# Patient Record
Sex: Male | Born: 1955 | Race: White | Hispanic: No | Marital: Married | Smoking: Never smoker
Health system: Southern US, Community
[De-identification: ages and names within clinical notes are randomized; demographics above are authoritative.]

## PROBLEM LIST (undated history)

## (undated) DIAGNOSIS — I10 Essential (primary) hypertension: Secondary | ICD-10-CM

## (undated) HISTORY — PX: CHOLECYSTECTOMY: SHX55

## (undated) HISTORY — PX: APPENDECTOMY: SHX54

---

## 2002-12-23 ENCOUNTER — Ambulatory Visit (HOSPITAL_COMMUNITY): Admission: RE | Admit: 2002-12-23 | Discharge: 2002-12-24 | Payer: Self-pay | Admitting: General Surgery

## 2002-12-23 ENCOUNTER — Encounter (INDEPENDENT_AMBULATORY_CARE_PROVIDER_SITE_OTHER): Payer: Self-pay | Admitting: *Deleted

## 2008-01-29 ENCOUNTER — Encounter: Admission: RE | Admit: 2008-01-29 | Discharge: 2008-01-29 | Payer: Self-pay | Admitting: Family Medicine

## 2010-06-17 NOTE — Op Note (Signed)
NAME:  Jon Ingram, Jon Ingram                          ACCOUNT NO.:  [id number]   MEDICAL RECORD NO.:  [id number]                   PATIENT TYPE:  OIB   LOCATION:  NA                                   FACILITY:  [facility]   PHYSICIAN:  Gabrielle Dare. Janee Morn, M.D.             DATE OF BIRTH:  1955/12/09   DATE OF PROCEDURE:  12/23/2002  DATE OF DISCHARGE:                                 OPERATIVE REPORT   PREOPERATIVE DIAGNOSIS:  1. Gallstones.  2. Umbilical hernia.   POSTOPERATIVE DIAGNOSIS:  1. Gallstones.  2. Umbilical hernia.   OPERATION PERFORMED:  1. Laparoscopic cholecystectomy with intraoperative cholangiogram.  2. Repair of umbilical hernia.   SURGEON:  Gabrielle Dare. Janee Morn, M.D.   ASSISTANT:  Sharlet Salina T. Hoxworth, M.D.   ANESTHESIA:  General.   INDICATIONS FOR PROCEDURE:  The patient is a 55 year old white male whom I  evaluated in the office for symptomatic cholelithiasis.  He also had  recently noted an umbilical hernia which was also symptomatic.  He presents  today for elective laparoscopic cholecystectomy with intraoperative  cholangiogram and repair of this umbilical hernia.   DESCRIPTION OF PROCEDURE:  Informed consent was obtained.  The patient  received intravenous antibiotics.  He was taken to the operating room.  General anesthesia was administered.  His abdomen was prepped and draped in  a sterile fashion.  A curvilinear infraumbilical incision was made.  Subcutaneous tissues were dissected down.  The umbilical hernia defect was  less than 1 cm in size.  The umbilicus was circumferentially dissected and  then dissected free off the fascia without damaging the skin.  The defect  was then visible.  There was no content within the hernia at this time.  We  then used this as our entry into the abdomen with plans to close it and fix  the hernia after removing our Hasson trocar at the end of the case.  Subsequently, as 0 Vicryl pursestring suture was placed around this  fascial  opening, the Hasson trocar was inserted into the abdomen and the abdomen was  insufflated with carbon dioxide in standard fashion.  Under direct vision,  an 11 mm epigastric and two 5 mm lateral ports were placed.  The dome of the  gallbladder was retracted superomedially.  The infundibulum was retracted  inferolaterally.  Several adhesions of omentum were present down at the  infundibulum area. These were taken down carefully without difficulty.  Dissection was begun medially and progressed laterally.  First the cystic  artery was found and this was overlying the cystic duct.  The cystic artery  was circumferentially dissected.  Two clips were placed proximal, one distal  and it was divided.  This facilitated better visualization of the cystic  duct which was then easily dissected. A large window was created between the  infundibulum, the cystic duct and the liver.  Once adequate visualization  and dissection had taken  place, the clip was placed on the infundibulocystic  duct junction.  A small nick was made in the cystic duct and a Cook catheter  was used to obtain an intraoperative cholangiogram.  This demonstrated no  filling defects in the common bile duct and there was a segment of the  cystic duct visible at well.  The [facility] catheter was removed and three clips  were placed proximally on the cystic duct and it was divided.  The  gallbladder was then taken off the liver bed with Bovie cautery.  Several  areas of the liver bed were cauterized to get excellent hemostasis. The  gallbladder was placed in an EndoCatch bag and removed through the umbilical  port site.  At this time the liver bed was re-explored.  One small site of  bleeding was cauterized with excellent hemostasis.  The abdomen was  copiously irrigated and the irrigant returned clear.  The liver bed was  rechecked and good hemostasis was maintained.  The ports were removed under  direct vision.  The pneumoperitoneum  was released and the Hasson trocar was  removed.  Subsequently, the umbilical defect was closed first by tying the 0  Vicryl pursestring suture and then several interrupted 0 Vicryl stitches  were placed to reinforce this closure.  The umbilicus was then tacked back  down to the fascia using interrupted 3-0 Vicryl sutures.  All four wounds  were copiously irrigated.  0.25% Marcaine with epinephrine had been used for  local anesthetic.  The skin of each was closed with a running 4-0 Vicryl  subcuticular stitch.  Benzoin and Steri-Strips and sterile dressings were  applied.  Sponge, needle and instrument counts were correct.  The patient  tolerated the procedure without apparent complication and was taken to the  recovery room in stable condition.                                               Gabrielle Dare Janee Morn, M.D.    BET/MEDQ  D:  12/23/2002  T:  12/23/2002  Job:  [id number]

## 2010-08-17 IMAGING — CR DG ELBOW 2V*L*
2 series · 2 of 2 positions shown · non-contrast
Comparison: None

CLINICAL DATA: Pain after lifting heavy object

LEFT ELBOW - 2 VIEW

[view not recorded (1 of 2)]
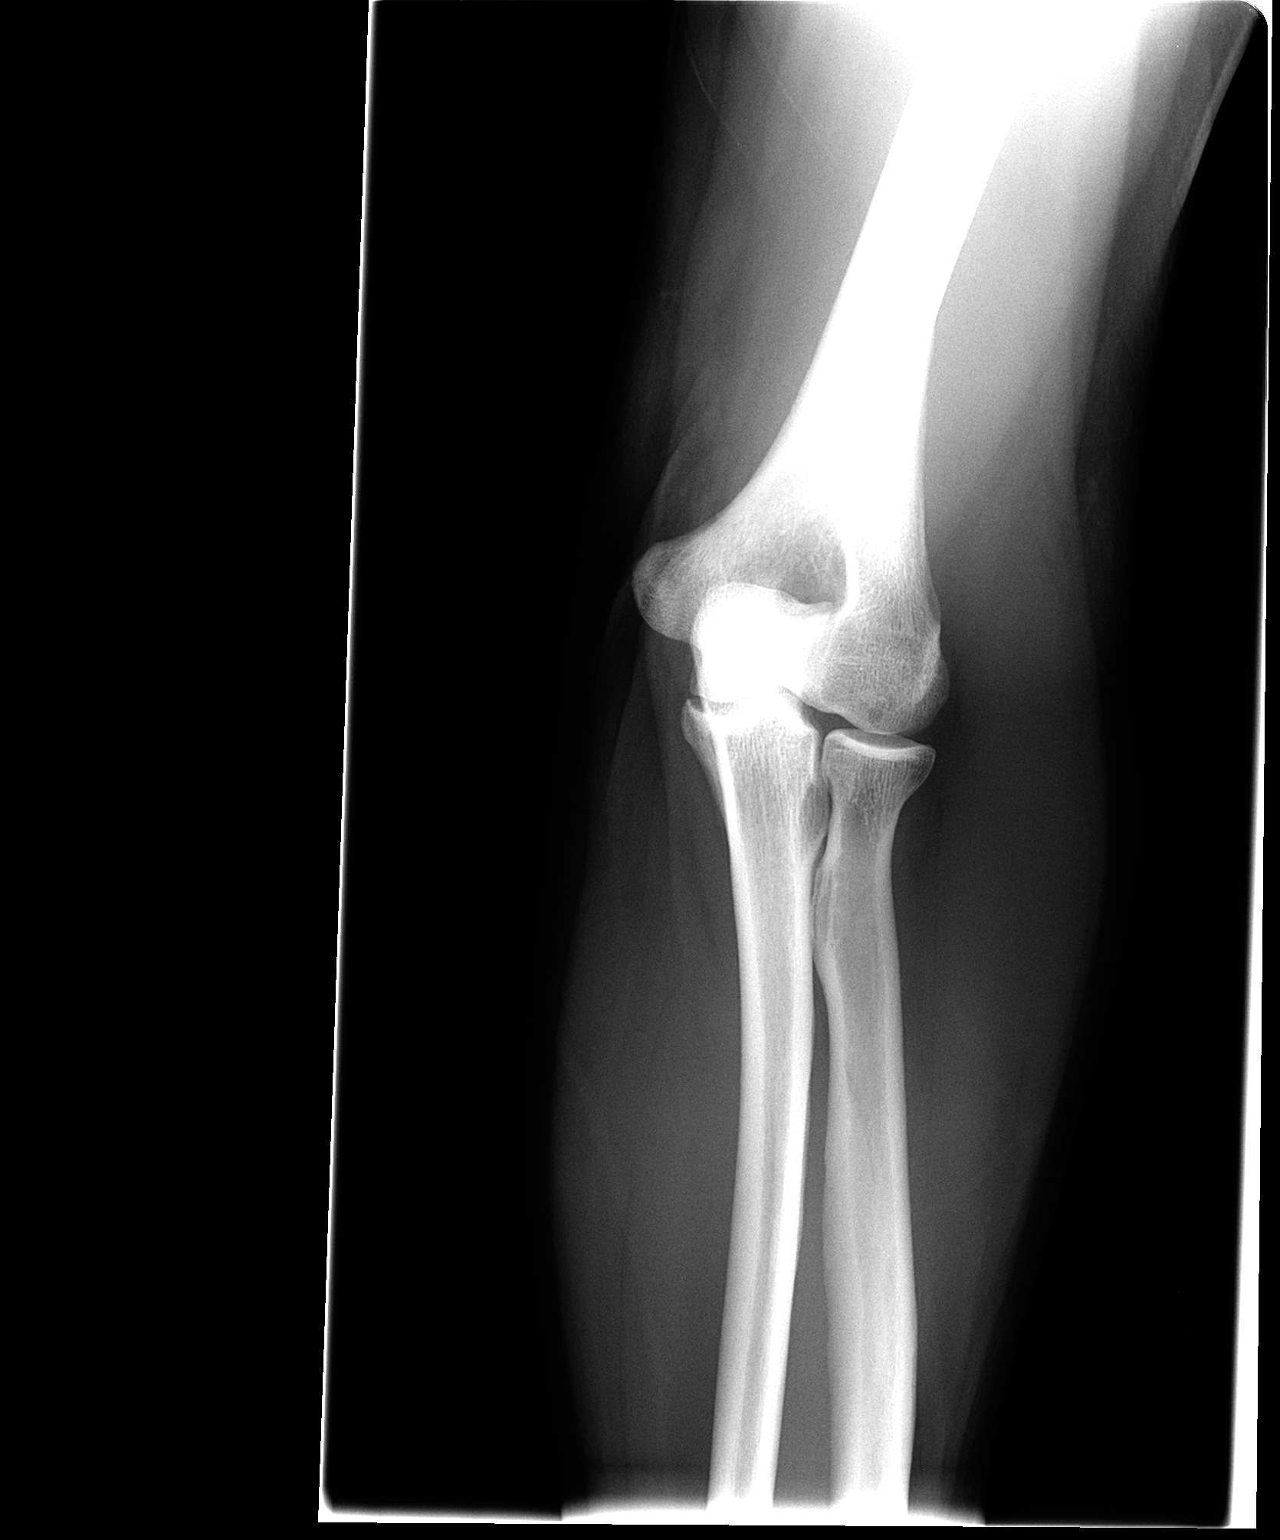

[view not recorded (2 of 2)]
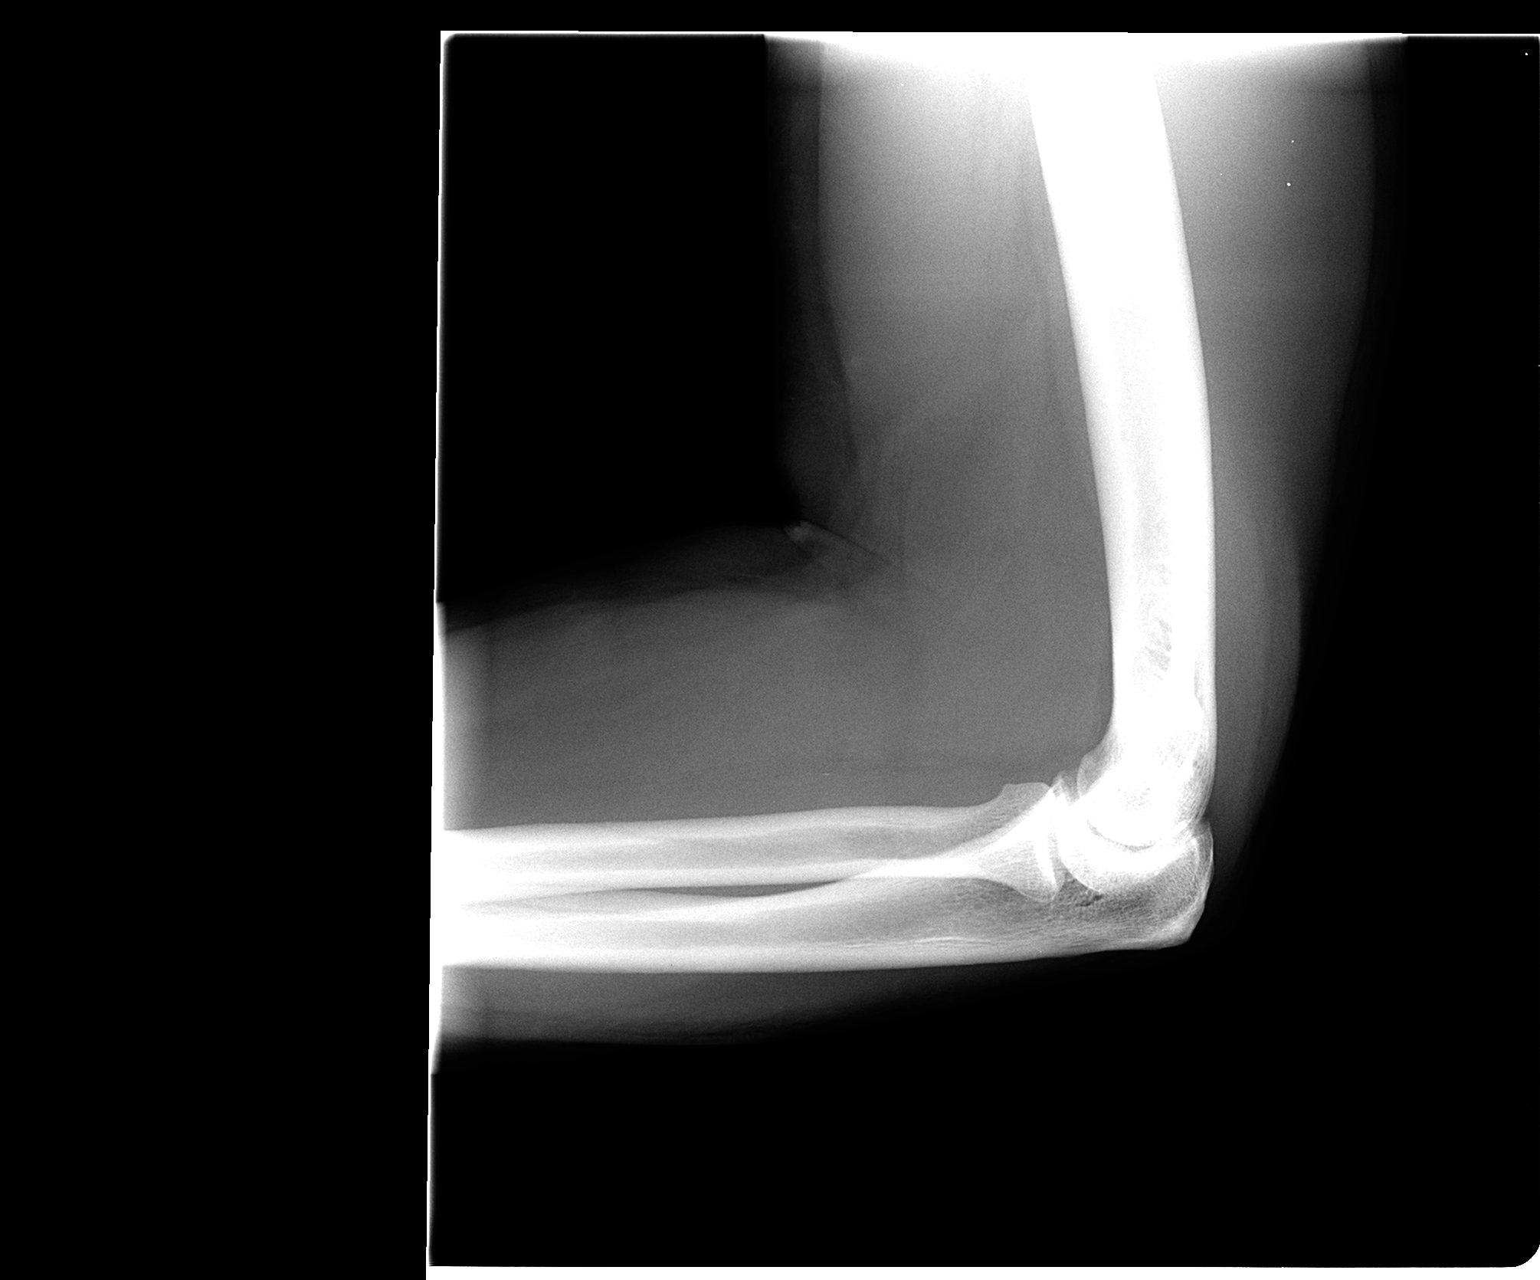

[2 of 2 positions shown; findings below may reference images not displayed]

FINDINGS: Two views of the left elbow show no evidence of left
elbow joint space effusion.  No acute fracture is seen and
alignment is normal.
IMPRESSION: Negative left elbow.

## 2014-03-20 ENCOUNTER — Ambulatory Visit (INDEPENDENT_AMBULATORY_CARE_PROVIDER_SITE_OTHER): Admitting: Podiatry

## 2014-03-20 ENCOUNTER — Encounter: Payer: Self-pay | Admitting: Podiatry

## 2014-03-20 ENCOUNTER — Ambulatory Visit (INDEPENDENT_AMBULATORY_CARE_PROVIDER_SITE_OTHER)

## 2014-03-20 DIAGNOSIS — R52 Pain, unspecified: Secondary | ICD-10-CM

## 2014-03-20 DIAGNOSIS — M19079 Primary osteoarthritis, unspecified ankle and foot: Secondary | ICD-10-CM

## 2014-03-20 DIAGNOSIS — M129 Arthropathy, unspecified: Secondary | ICD-10-CM

## 2014-03-20 NOTE — Progress Notes (Signed)
   Subjective:    Patient ID: Jon Ingram, male    DOB: 03-03-1955, 59 y.o.   MRN: [id number]  HPI 59 year old male presents the office with complaints of bilateral foot pain with the left greater than right. He states that he has had pain in the last 2 years has been progressive. He states he has pain after walking for long distances or with prolonged standing. He has really tried over-the-counter ankle brace which helps some however does not completely resolve his symptoms. He states he previously seen orthopedic surgeon who recommended total ankle replacement or fusion however he does not want either these procedures performed. He denies any history of injury to bilateral fluctuance. No other complaints at this time.  Review of Systems  All other systems reviewed and are negative.      Objective:   Physical Exam AAO 3, NAD DP/PT pulses palpable, CRT less than 3 seconds  Protective sensation intact with Simms Weinstein monofilament, vibratory sensation intact, Achilles tendon reflex intact. There is a significant decrease in medial arch height upon weightbearing with mild valgus deformity ankle. There is pain and mild crepitation with range of motion ankle joint on the left greater than the right. Decreased ability joint range of motion bilaterally left greater than the right however there is no crepitation or no pain with range of motion. Equinus present bilaterally. There is no pinpoint bony tenderness or pain with vibratory sensation of bilateral lower extremity. There is no over 1 edema, erythema, increase in warmth. MMT 5/5, ROM WNL No open lesions or pre-ulcer lesions identified bilaterally. No pain with calf compression, swelling, warmth, erythema.     Assessment & Plan:  59 year old male bilateral ankle pain with the left greater than right with valgus deformity, arthritis -X-rays were obtained and reviewed the patient. There is doing changes of the ankle joint (the right with  valgus deformity present. -At this time I agreed recommend either total ankle replacement or ankle fusion. First any without replacement likely needs a total joint fusion as well. I will see any other short-term fixes surgically. -Patient does not to proceed with surgery this time. Discussed conservative options including AFO/[place] brace. A prescription for this was given to the patient for biotech. -Follow-up after brace. In the meantime occurs all the office with any questions, concerns, change in symptoms.

## 2015-06-04 ENCOUNTER — Emergency Department (HOSPITAL_BASED_OUTPATIENT_CLINIC_OR_DEPARTMENT_OTHER)
Admission: EM | Admit: 2015-06-04 | Discharge: 2015-06-04 | Disposition: A | Discharge status: Home / Self Care | Payer: TRICARE For Life (TFL) | Attending: Emergency Medicine | Admitting: Emergency Medicine

## 2015-06-04 ENCOUNTER — Emergency Department (HOSPITAL_BASED_OUTPATIENT_CLINIC_OR_DEPARTMENT_OTHER): Payer: TRICARE For Life (TFL)

## 2015-06-04 ENCOUNTER — Encounter (HOSPITAL_BASED_OUTPATIENT_CLINIC_OR_DEPARTMENT_OTHER): Payer: Self-pay | Admitting: *Deleted

## 2015-06-04 DIAGNOSIS — Y929 Unspecified place or not applicable: Secondary | ICD-10-CM | POA: Insufficient documentation

## 2015-06-04 DIAGNOSIS — S0093XA Contusion of unspecified part of head, initial encounter: Secondary | ICD-10-CM | POA: Diagnosis not present

## 2015-06-04 DIAGNOSIS — S0990XA Unspecified injury of head, initial encounter: Secondary | ICD-10-CM | POA: Diagnosis present

## 2015-06-04 DIAGNOSIS — W01198A Fall on same level from slipping, tripping and stumbling with subsequent striking against other object, initial encounter: Secondary | ICD-10-CM | POA: Diagnosis not present

## 2015-06-04 DIAGNOSIS — I10 Essential (primary) hypertension: Secondary | ICD-10-CM | POA: Insufficient documentation

## 2015-06-04 DIAGNOSIS — Y939 Activity, unspecified: Secondary | ICD-10-CM | POA: Insufficient documentation

## 2015-06-04 DIAGNOSIS — Y999 Unspecified external cause status: Secondary | ICD-10-CM | POA: Insufficient documentation

## 2015-06-04 HISTORY — DX: Essential (primary) hypertension: I10

## 2015-06-04 NOTE — ED Notes (Signed)
Patient transported to CT 

## 2015-06-04 NOTE — Discharge Instructions (Signed)

## 2015-06-04 NOTE — ED Provider Notes (Addendum)
CSN: [id number]     Arrival date & time 06/04/15  1128 History   First MD Initiated Contact with Patient 06/04/15 1141     Chief Complaint  Patient presents with  . Fall     (Consider location/radiation/quality/duration/timing/severity/associated sxs/prior Treatment) Patient is a 60 y.o. male presenting with fall. The history is provided by the patient.  Fall This is a new (Patient went outside on his deck last night at 3 AM to unclog a drain and when he was going back and he slipped and fell backwards hitting his head onto the wooden deck) problem. The current episode started 6 to 12 hours ago. The problem occurs constantly. The problem has not changed since onset.Associated symptoms include headaches. Associated symptoms comments: Neck pain.  No loss of consciousness, nausea, vomiting. No numbness or weakness in the extremities. No pain shooting into the arms or the legs. No lower back pain. Vision is unchanged.. Nothing aggravates the symptoms. The symptoms are relieved by NSAIDs. He has tried nothing for the symptoms. The treatment provided no relief.    Past Medical History  Diagnosis Date  . Hypertension    Past Surgical History  Procedure Laterality Date  . Cholecystectomy    . Appendectomy     No family history on file. Social History  Substance Use Topics  . Smoking status: Never Smoker   . Smokeless tobacco: None  . Alcohol Use: Yes    Review of Systems  Neurological: Positive for headaches.  All other systems reviewed and are negative.     Allergies  Review of patient's allergies indicates no known allergies.  Home Medications   Prior to Admission medications   Medication Sig Start Date End Date Taking? Authorizing Provider  ibuprofen (ADVIL,MOTRIN) 200 MG tablet Take 200 mg by mouth.    Historical Provider, MD  lisinopril (PRINIVIL,ZESTRIL) 2.5 MG tablet Take 5 mg by mouth.    Historical Provider, MD   BP 155/94 mmHg  Pulse 60  Temp(Src) 98 F (36.7 C)  (Oral)  Resp 18  Ht 5\' 8"  (1.727 m)  Wt 220 lb (99.791 kg)  BMI 33.46 kg/m2  SpO2 98% Physical Exam  Constitutional: He is oriented to person, place, and time. He appears well-developed and well-nourished. No distress.  HENT:  Head: Normocephalic and atraumatic.  Mouth/Throat: Oropharynx is clear and moist.  Eyes: Conjunctivae and EOM are normal. Pupils are equal, round, and reactive to light.  Neck: Normal range of motion. Neck supple. Spinous process tenderness present. No muscular tenderness present.    Cardiovascular: Normal rate, regular rhythm and intact distal pulses.   No murmur heard. Pulmonary/Chest: Effort normal and breath sounds normal. No respiratory distress. He has no wheezes. He has no rales.  Musculoskeletal: Normal range of motion. He exhibits no edema or tenderness.  Neurological: He is alert and oriented to person, place, and time. He has normal strength. No cranial nerve deficit or sensory deficit. Gait normal.  Skin: Skin is warm and dry. No rash noted. No erythema.  Psychiatric: He has a normal mood and affect. His behavior is normal.  Nursing note and vitals reviewed.   ED Course  Procedures (including critical care time) Labs Review Labs Reviewed - No data to display  Imaging Review Ct Head Wo Contrast  06/04/2015  CLINICAL DATA:  Head and neck pain after fall. No reported loss of consciousness. EXAM: CT HEAD WITHOUT CONTRAST CT CERVICAL SPINE WITHOUT CONTRAST TECHNIQUE: Multidetector CT imaging of the head and cervical spine was  performed following the standard protocol without intravenous contrast. Multiplanar CT image reconstructions of the cervical spine were also generated. COMPARISON:  None. FINDINGS: CT HEAD FINDINGS Bony calvarium appears intact. No mass effect or midline shift is noted. Ventricular size is within normal limits. There is no evidence of mass lesion, hemorrhage or acute infarction. CT CERVICAL SPINE FINDINGS No fracture or  spondylolisthesis is noted. Mild anterior osteophyte formation is seen at multiple levels. Disc spaces and posterior facet joints appear to be well maintained. Visualized lung apices appear normal. IMPRESSION: Normal head CT. Minimal degenerative changes are noted. No acute abnormality seen in the cervical spine. Electronically Signed   By: Marijo Conception, M.D.   On: 06/04/2015 12:56   Ct Cervical Spine Wo Contrast  06/04/2015  CLINICAL DATA:  Head and neck pain after fall. No reported loss of consciousness. EXAM: CT HEAD WITHOUT CONTRAST CT CERVICAL SPINE WITHOUT CONTRAST TECHNIQUE: Multidetector CT imaging of the head and cervical spine was performed following the standard protocol without intravenous contrast. Multiplanar CT image reconstructions of the cervical spine were also generated. COMPARISON:  None. FINDINGS: CT HEAD FINDINGS Bony calvarium appears intact. No mass effect or midline shift is noted. Ventricular size is within normal limits. There is no evidence of mass lesion, hemorrhage or acute infarction. CT CERVICAL SPINE FINDINGS No fracture or spondylolisthesis is noted. Mild anterior osteophyte formation is seen at multiple levels. Disc spaces and posterior facet joints appear to be well maintained. Visualized lung apices appear normal. IMPRESSION: Normal head CT. Minimal degenerative changes are noted. No acute abnormality seen in the cervical spine. Electronically Signed   By: Marijo Conception, M.D.   On: 06/04/2015 12:56   I have personally reviewed and evaluated these images and lab results as part of my medical decision-making.   EKG Interpretation None      MDM   Final diagnoses:  Head contusion, initial encounter    Patient with a mechanical fall last night hitting his head on the wooden tach without loss of consciousness. He takes no anticoagulation but continues to have a headache and neck pain this morning. He is neurologically intact on exam. CT of the head and neck  pending.  1:01 PM Imaging is normal. Patient was discharged home.  Blanchie Dessert, MD 06/04/15 [place], MD 06/04/15 [phone]

## 2015-06-04 NOTE — ED Notes (Signed)
Pt ambulatory to discharge window with steady gait

## 2015-06-04 NOTE — ED Notes (Signed)
He slipped and fell backward hitting the back of his head on a wood deck. No LOC. No hematoma. C.o headache.

## 2015-06-23 ENCOUNTER — Telehealth: Payer: Self-pay | Admitting: *Deleted

## 2015-06-23 DIAGNOSIS — M19079 Primary osteoarthritis, unspecified ankle and foot: Secondary | ICD-10-CM

## 2015-06-23 NOTE — Telephone Encounter (Signed)
For a second opinion please contact:  Dr. Selene G. Parekh, MD [address]  [place], [place] [address] [phone]  

## 2015-06-23 NOTE — Telephone Encounter (Addendum)
Pt called for referral to [facility] for Dr. Pasty Spillers an ankle surgeon.  06/24/2015-Informed pt of the preferred referral and pt agreed.  Faxed referral with our address and a request for clinical updates on pt status, clinicals and pt's demographics.

## 2015-06-25 ENCOUNTER — Telehealth: Payer: Self-pay | Admitting: *Deleted

## 2015-06-25 NOTE — Telephone Encounter (Signed)
Fax received pt is scheduled with Dr. Donnie Mesa on 07/19/2015 at 145pm.

## 2015-07-19 DIAGNOSIS — M12579 Traumatic arthropathy, unspecified ankle and foot: Secondary | ICD-10-CM | POA: Insufficient documentation

## 2015-12-28 DIAGNOSIS — M25372 Other instability, left ankle: Secondary | ICD-10-CM | POA: Insufficient documentation

## 2015-12-28 DIAGNOSIS — M6702 Short Achilles tendon (acquired), left ankle: Secondary | ICD-10-CM | POA: Insufficient documentation

## 2016-01-04 DIAGNOSIS — Z96662 Presence of left artificial ankle joint: Secondary | ICD-10-CM | POA: Insufficient documentation

## 2016-01-04 DIAGNOSIS — Z471 Aftercare following joint replacement surgery: Secondary | ICD-10-CM | POA: Insufficient documentation

## 2016-05-08 DIAGNOSIS — M722 Plantar fascial fibromatosis: Secondary | ICD-10-CM | POA: Insufficient documentation

## 2016-09-04 ENCOUNTER — Ambulatory Visit: Admitting: Podiatry

## 2016-09-25 ENCOUNTER — Ambulatory Visit: Admitting: Podiatry

## 2016-10-09 ENCOUNTER — Encounter: Payer: Self-pay | Admitting: Podiatry

## 2016-10-09 ENCOUNTER — Ambulatory Visit (INDEPENDENT_AMBULATORY_CARE_PROVIDER_SITE_OTHER): Admitting: Podiatry

## 2016-10-09 VITALS — BP 138/89 | HR 65 | Ht 67.0 in | Wt 225.0 lb

## 2016-10-09 DIAGNOSIS — M216X9 Other acquired deformities of unspecified foot: Secondary | ICD-10-CM

## 2016-10-09 DIAGNOSIS — M7751 Other enthesopathy of right foot: Secondary | ICD-10-CM

## 2016-10-09 DIAGNOSIS — M21969 Unspecified acquired deformity of unspecified lower leg: Secondary | ICD-10-CM | POA: Diagnosis not present

## 2016-10-09 NOTE — Patient Instructions (Signed)
Seen for painful knot on right foot dorsum. Noted of bone spur over the top of right foot. Also noted of weak first metatarsal bone and excess pronation of the STJ. Will do custom orthotics first before considering removal of the spur. May also benefit from repeat cortisone injections.

## 2016-10-09 NOTE — Progress Notes (Signed)
SUBJECTIVE: 61 y.o. year old male presents complaining of pain over the right foot. He was treated with cortisone injection that gave him some relief. Patient is referred by Dr. Leonette Nutting.  Stated that he has had left ankle replacement surgery and is doing well.   REVIEW OF SYSTEMS: Pertinent items noted in HPI and remainder of comprehensive ROS otherwise negative.  OBJECTIVE: DERMATOLOGIC EXAMINATION: No abnormal skin lesions noted.  VASCULAR EXAMINATION OF LOWER LIMBS: All pedal pulses are palpable with normal pulsation.  Capillary Filling times within 3 seconds in all digits.  No edema or erythema noted. Temperature gradient from tibial crest to dorsum of foot is within normal bilateral.  NEUROLOGIC EXAMINATION OF THE LOWER LIMBS: All epicritic and tactile sensations grossly intact. Sharp and Dull discriminatory sensations at the plantar ball of hallux is intact bilateral.   MUSCULOSKELETAL EXAMINATION: Positive for hypermobile first ray bilateral. Positive for STJ hyperpronation bilateral. Positive of palpable firm mass over dorsum right foot at about 2nd MCJ area.   Patient accompanied copy of right foot X-ray which reveal dorsal spurring of mid tarsal joint right foot in lateral view.   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 Bone spur midfoot dorsum right foot.  PLAN: Reviewed clinical findings and available treatment options, orthotics, proper shoe gear, repeat injections, and surgical options. Patient will return for further treatment.

## 2017-02-20 ENCOUNTER — Other Ambulatory Visit: Payer: Self-pay

## 2017-02-20 ENCOUNTER — Encounter (HOSPITAL_COMMUNITY): Payer: Self-pay

## 2017-02-20 ENCOUNTER — Observation Stay (HOSPITAL_COMMUNITY)
Admission: EM | Admit: 2017-02-20 | Discharge: 2017-02-22 | Disposition: A | Discharge status: Home / Self Care | Payer: TRICARE For Life (TFL) | Attending: Emergency Medicine | Admitting: Emergency Medicine

## 2017-02-20 ENCOUNTER — Emergency Department (HOSPITAL_COMMUNITY): Payer: TRICARE For Life (TFL)

## 2017-02-20 DIAGNOSIS — J181 Lobar pneumonia, unspecified organism: Principal | ICD-10-CM | POA: Diagnosis present

## 2017-02-20 DIAGNOSIS — I10 Essential (primary) hypertension: Secondary | ICD-10-CM | POA: Diagnosis not present

## 2017-02-20 DIAGNOSIS — Z79899 Other long term (current) drug therapy: Secondary | ICD-10-CM | POA: Insufficient documentation

## 2017-02-20 DIAGNOSIS — R079 Chest pain, unspecified: Secondary | ICD-10-CM | POA: Diagnosis not present

## 2017-02-20 DIAGNOSIS — R748 Abnormal levels of other serum enzymes: Secondary | ICD-10-CM | POA: Diagnosis not present

## 2017-02-20 DIAGNOSIS — R0789 Other chest pain: Secondary | ICD-10-CM

## 2017-02-20 DIAGNOSIS — F1099 Alcohol use, unspecified with unspecified alcohol-induced disorder: Secondary | ICD-10-CM | POA: Insufficient documentation

## 2017-02-20 DIAGNOSIS — F1090 Alcohol use, unspecified, uncomplicated: Secondary | ICD-10-CM | POA: Diagnosis present

## 2017-02-20 DIAGNOSIS — R7989 Other specified abnormal findings of blood chemistry: Secondary | ICD-10-CM | POA: Diagnosis present

## 2017-02-20 DIAGNOSIS — Z789 Other specified health status: Secondary | ICD-10-CM | POA: Diagnosis not present

## 2017-02-20 DIAGNOSIS — Z7289 Other problems related to lifestyle: Secondary | ICD-10-CM | POA: Diagnosis present

## 2017-02-20 DIAGNOSIS — R778 Other specified abnormalities of plasma proteins: Secondary | ICD-10-CM | POA: Diagnosis present

## 2017-02-20 LAB — BASIC METABOLIC PANEL
Anion gap: 13 (ref 5–15)
BUN: 20 mg/dL (ref 6–20)
CO2: 22 mmol/L (ref 22–32)
Calcium: 9.3 mg/dL (ref 8.9–10.3)
Chloride: 102 mmol/L (ref 101–111)
Creatinine, Ser: 1.08 mg/dL (ref 0.61–1.24)
GFR calc Af Amer: >60 mL/min (ref >60)
GFR calc non Af Amer: >60 mL/min (ref >60)
Glucose, Bld: 105 mg/dL — ABNORMAL HIGH (ref 65–99)
Potassium: 4 mmol/L (ref 3.5–5.1)
Sodium: 137 mmol/L (ref 135–145)

## 2017-02-20 LAB — I-STAT TROPONIN, ED
Troponin i, poc: 0.03 ng/mL (ref 0.00–0.08)
Troponin i, poc: 0.09 ng/mL (ref 0.00–0.08)

## 2017-02-20 LAB — CBC
HCT: 44.6 % (ref 39.0–52.0)
Hemoglobin: 15.6 g/dL (ref 13.0–17.0)
MCH: 31.1 pg (ref 26.0–34.0)
MCHC: 35 g/dL (ref 30.0–36.0)
MCV: 88.8 fL (ref 78.0–100.0)
Platelets: 227 10*3/uL (ref 150–400)
RBC: 5.02 MIL/uL (ref 4.22–5.81)
RDW: 13.2 % (ref 11.5–15.5)
WBC: 10.5 10*3/uL (ref 4.0–10.5)

## 2017-02-20 LAB — TROPONIN I: TROPONIN I: 0.08 ng/mL — AB (ref <0.03)

## 2017-02-20 MED ORDER — ZOLPIDEM TARTRATE 5 MG PO TABS: 5.0000 mg | ORAL_TABLET | Freq: Every evening | ORAL | Status: DC | PRN

## 2017-02-20 MED ORDER — ALBUTEROL SULFATE (2.5 MG/3ML) 0.083% IN NEBU: 2.5000 mg | INHALATION_SOLUTION | RESPIRATORY_TRACT | Status: DC | PRN

## 2017-02-20 MED ORDER — LISINOPRIL 2.5 MG PO TABS
2.5000 mg | ORAL_TABLET | Freq: Every day | ORAL | Status: DC
  Administered 2017-02-21 – 2017-02-22 (×2): 2.5 mg via ORAL
  Filled 2017-02-20 (×2): qty 1

## 2017-02-20 MED ORDER — ALUM & MAG HYDROXIDE-SIMETH 200-200-20 MG/5ML PO SUSP: 30.0000 mL | Freq: Four times a day (QID) | ORAL | Status: DC | PRN

## 2017-02-20 MED ORDER — HYDRALAZINE HCL 20 MG/ML IJ SOLN: 5.0000 mg | INTRAMUSCULAR | Status: DC | PRN

## 2017-02-20 MED ORDER — MORPHINE SULFATE (PF) 4 MG/ML IV SOLN: 2.0000 mg | INTRAVENOUS | Status: DC | PRN

## 2017-02-20 MED ORDER — DEXTROSE 5 % IV SOLN
1.0000 g | INTRAVENOUS | Status: DC
  Administered 2017-02-21: 1 g via INTRAVENOUS
  Filled 2017-02-20 (×2): qty 10

## 2017-02-20 MED ORDER — ASPIRIN 81 MG PO CHEW
324.0000 mg | CHEWABLE_TABLET | Freq: Once | ORAL | Status: AC
  Administered 2017-02-20: 324 mg via ORAL
  Filled 2017-02-20: qty 4

## 2017-02-20 MED ORDER — VITAMIN B 12 100 MCG PO LOZG: 500.0000 mg | LOZENGE | Freq: Every day | ORAL | Status: DC

## 2017-02-20 MED ORDER — DOXYCYCLINE HYCLATE 100 MG PO TABS
100.0000 mg | ORAL_TABLET | Freq: Two times a day (BID) | ORAL | Status: DC
  Administered 2017-02-20 – 2017-02-22 (×4): 100 mg via ORAL
  Filled 2017-02-20 (×4): qty 1

## 2017-02-20 MED ORDER — ATORVASTATIN CALCIUM 40 MG PO TABS
40.0000 mg | ORAL_TABLET | Freq: Every day | ORAL | Status: DC
  Administered 2017-02-21: 40 mg via ORAL
  Filled 2017-02-20 (×2): qty 1

## 2017-02-20 MED ORDER — ACETAMINOPHEN 325 MG PO TABS
650.0000 mg | ORAL_TABLET | ORAL | Status: DC | PRN
Start: 2017-02-20 — End: 2017-02-22

## 2017-02-20 MED ORDER — LORAZEPAM 2 MG/ML IJ SOLN: 0.0000 mg | Freq: Four times a day (QID) | INTRAMUSCULAR | Status: DC

## 2017-02-20 MED ORDER — SODIUM CHLORIDE 0.9 % IV SOLN
INTRAVENOUS | Status: DC
  Administered 2017-02-20 – 2017-02-21 (×2): via INTRAVENOUS

## 2017-02-20 MED ORDER — ENOXAPARIN SODIUM 40 MG/0.4ML ~~LOC~~ SOLN
40.0000 mg | SUBCUTANEOUS | Status: DC
  Filled 2017-02-20: qty 0.4

## 2017-02-20 MED ORDER — LORAZEPAM 2 MG/ML IJ SOLN: 1.0000 mg | Freq: Four times a day (QID) | INTRAMUSCULAR | Status: DC | PRN

## 2017-02-20 MED ORDER — LORAZEPAM 2 MG/ML IJ SOLN: 0.0000 mg | Freq: Two times a day (BID) | INTRAMUSCULAR | Status: DC

## 2017-02-20 MED ORDER — DEXTROSE 5 % IV SOLN
1.0000 g | Freq: Once | INTRAVENOUS | Status: AC
  Administered 2017-02-20: 1 g via INTRAVENOUS
  Filled 2017-02-20: qty 10

## 2017-02-20 MED ORDER — LORAZEPAM 1 MG PO TABS: 1.0000 mg | ORAL_TABLET | Freq: Four times a day (QID) | ORAL | Status: DC | PRN

## 2017-02-20 MED ORDER — ASPIRIN 81 MG PO CHEW
324.0000 mg | CHEWABLE_TABLET | Freq: Every day | ORAL | Status: DC
  Administered 2017-02-21 – 2017-02-22 (×2): 324 mg via ORAL
  Filled 2017-02-20 (×3): qty 4

## 2017-02-20 MED ORDER — VITAMIN B-1 100 MG PO TABS: 100.0000 mg | ORAL_TABLET | Freq: Every day | ORAL | Status: DC

## 2017-02-20 MED ORDER — PREDNISONE 20 MG PO TABS
20.0000 mg | ORAL_TABLET | Freq: Two times a day (BID) | ORAL | Status: AC
  Administered 2017-02-21 (×2): 20 mg via ORAL
  Filled 2017-02-20 (×2): qty 1

## 2017-02-20 MED ORDER — ADULT MULTIVITAMIN W/MINERALS CH: 1.0000 | ORAL_TABLET | Freq: Every day | ORAL | Status: DC

## 2017-02-20 MED ORDER — FOLIC ACID 1 MG PO TABS: 1.0000 mg | ORAL_TABLET | Freq: Every day | ORAL | Status: DC

## 2017-02-20 MED ORDER — NITROGLYCERIN 0.4 MG SL SUBL: 0.4000 mg | SUBLINGUAL_TABLET | SUBLINGUAL | Status: DC | PRN

## 2017-02-20 MED ORDER — BENZONATATE 100 MG PO CAPS: 100.0000 mg | ORAL_CAPSULE | Freq: Three times a day (TID) | ORAL | Status: DC | PRN

## 2017-02-20 MED ORDER — ONDANSETRON HCL 4 MG/2ML IJ SOLN: 4.0000 mg | Freq: Four times a day (QID) | INTRAMUSCULAR | Status: DC | PRN

## 2017-02-20 MED ORDER — VITAMIN B-12 100 MCG PO TABS
500.0000 ug | ORAL_TABLET | Freq: Every day | ORAL | Status: DC
  Filled 2017-02-20: qty 5

## 2017-02-20 MED ORDER — THIAMINE HCL 100 MG/ML IJ SOLN: 100.0000 mg | Freq: Every day | INTRAMUSCULAR | Status: DC

## 2017-02-20 NOTE — ED Notes (Signed)
Dr. Winfred Leeds not in his office I-stat troponin result given to [place]

## 2017-02-20 NOTE — ED Notes (Signed)
Admitting provider at bedside.

## 2017-02-20 NOTE — ED Notes (Signed)
Coming from [facility]

## 2017-02-20 NOTE — ED Provider Notes (Signed)
[place] EMERGENCY DEPARTMENT Provider Note   CSN: [id number] Arrival date & time: 02/20/17  1459     History   Chief Complaint Chief Complaint  Patient presents with  . Chest Pain    HPI BODI PALMERI is a 62 y.o. male.  HPI Patient presents to the emergency room for evaluation of sharp pain in the bilateral chest.  Patient states he had sudden onset that lasted about 30 minutes a couple of hours ago.  He felt lightheaded it went from the top to the bottom of his chest bilaterally.  He did not feel short of breath he had some nausea but no diaphoresis and the pain did not radiate.  He denies any abdominal pain.  He denies any history of heart or lung disease.  No history of PE or DVT.  No leg swelling.  He has had a recent respiratory illness, bronchitis, sinusitis.  He was treated for sinusitis with azithromycin.  He does continue to have a cough.  Symptoms have all resolved and he feels fine now.  No family history of heart disease.  Patient does not smoke. Past Medical History:  Diagnosis Date  . Hypertension     There are no active problems to display for this patient.   Past Surgical History:  Procedure Laterality Date  . APPENDECTOMY    . CHOLECYSTECTOMY         Home Medications    Prior to Admission medications   Medication Sig Start Date End Date Taking? Authorizing Provider  benzonatate (TESSALON) 100 MG capsule Take 100 mg by mouth 3 (three) times daily as needed for cough.   Yes [provider]  Cyanocobalamin (VITAMIN B 12 PO) Take 500 mg by mouth daily.   Yes [provider]  ibuprofen (ADVIL,MOTRIN) 200 MG tablet Take 200 mg by mouth.   Yes [provider]  lisinopril (PRINIVIL,ZESTRIL) 2.5 MG tablet Take 2.5 mg by mouth daily.    Yes [provider]  azithromycin (ZITHROMAX) 250 MG tablet Take 250 mg by mouth daily.    [provider]    Family History No family history on  file.  Social History Social History   Tobacco Use  . Smoking status: Never Smoker  . Smokeless tobacco: Never Used  Substance Use Topics  . Alcohol use: Yes  . Drug use: No     Allergies   Patient has no known allergies.   Review of Systems Review of Systems  All other systems reviewed and are negative.    Physical Exam Updated Vital Signs BP (!) 150/83 (BP Location: Right Arm)   Pulse (!) 59   Temp 97.6 F (36.4 C) (Oral)   Resp 16   SpO2 97%   Physical Exam  Constitutional: He appears well-developed and well-nourished. No distress.  HENT:  Head: Normocephalic and atraumatic.  Right Ear: External ear normal.  Left Ear: External ear normal.  Eyes: Conjunctivae are normal. Right eye exhibits no discharge. Left eye exhibits no discharge. No scleral icterus.  Neck: Neck supple. No tracheal deviation present.  Cardiovascular: Normal rate, regular rhythm and intact distal pulses.  Pulmonary/Chest: Effort normal and breath sounds normal. No stridor. No respiratory distress. He has no wheezes. He has no rales.  Abdominal: Soft. Bowel sounds are normal. He exhibits no distension. There is no tenderness. There is no rebound and no guarding.  Musculoskeletal: He exhibits no edema or tenderness.  Neurological: He is alert. He has normal strength. No  cranial nerve deficit (no facial droop, extraocular movements intact, no slurred speech) or sensory deficit. He exhibits normal muscle tone. He displays no seizure activity. Coordination normal.  Skin: Skin is warm and dry. No rash noted.  Psychiatric: He has a normal mood and affect.  Nursing note and vitals reviewed.    ED Treatments / Results  Labs (all labs ordered are listed, but only abnormal results are displayed) Labs Reviewed  BASIC METABOLIC PANEL - Abnormal; Notable for the following components:      Result Value   Glucose, Bld 105 (*)    All other components within normal limits  I-STAT TROPONIN, ED -  Abnormal; Notable for the following components:   Troponin i, poc 0.09 (*)    All other components within normal limits  CBC  I-STAT TROPONIN, ED    EKG  EKG Interpretation  Date/Time:  Tuesday February 20 2017 15:01:50 EST Ventricular Rate:  66 PR Interval:  172 QRS Duration: 84 QT Interval:  402 QTC Calculation: 421 R Axis:   0 Text Interpretation:  Normal sinus rhythm Normal ECG No old tracing to compare Confirmed by Dorie Rank [phone]) on 02/20/2017 3:58:47 PM       Radiology Dg Chest 2 View  Result Date: 02/20/2017 CLINICAL DATA:  Chest pain. EXAM: CHEST  2 VIEW COMPARISON:  No recent prior. FINDINGS: Mediastinum and hilar structures normal. Heart size normal. Mild infiltrate noted over the anterior lungs on lateral view. Mild right middle lobe and/or lingular pneumonia cannot be excluded. No pleural effusion or pneumothorax. IMPRESSION: Mild infiltrate noted the anterior chest on lateral view. Mild right middle lobe and/or lingular pneumonia cannot be excluded. Electronically Signed   By: Marcello Moores  Register   On: 02/20/2017 15:57    Procedures Procedures (including critical care time)  Medications Ordered in ED Medications  cefTRIAXone (ROCEPHIN) 1 g in dextrose 5 % 50 mL IVPB (not administered)     Initial Impression / Assessment and Plan / ED Course  I have reviewed the triage vital signs and the nursing notes.  Pertinent labs & imaging results that were available during my care of the patient were reviewed by me and considered in my medical decision making (see chart for details).  Clinical Course as of Feb 21 1919  Tue Feb 20, 2017  1909 Delta troponin increased.    [JK]  1910 Patient has a mildly positive troponin however still overall in the low risk category per heart score.  Pt is also having respiratory sx and cxr suggests possible pna.  Will consult with cardiology.  Consider repeat trop vs admission observation, further eval  [JK]  1920 I discussed the case  with Dr. Harl Bowie.  He was the patient in consultation.  He recommends admission to the medical service.  [JK]    Clinical Course User Index [JK] Dorie Rank, MD    Patient presented to the emergency room for evaluation of chest pain.  Patient's initial cardiac enzymes are normal.  Chest x-ray suggest the possibility of pneumonia and he has had some respiratory symptoms.  Patient's delta troponin however was elevated.  I consulted with cardiology.  Recommendation is to admit to the medical service and cycle the enzymes.  Cardiology will consult on the patient.  I discussed these recommendations with the patient and he agrees.  Final Clinical Impressions(s) / ED Diagnoses   Final diagnoses:  Chest pain, unspecified type  Elevated troponin      Dorie Rank, MD 02/20/17 [place]ly Rim

## 2017-02-20 NOTE — ED Provider Notes (Signed)
Patient placed in Quick Look pathway, seen and evaluated   Chief Complaint: chest pain  HPI:  [name] presents today with complaints of chest pain.  Approximately 2 hours ago patient reports acute onset of anterior sharp chest pain radiating from the top down to the bottom of the chest.  He notes this lasted approximately 30 minutes and then resolved completely.  Patient denies any associated shortness of breath, reports some associated nausea, denies diaphoresis or radiation of symptoms.  He denies any abdominal pain.  Patient with history of hypertension, denies any smoking history, personal or family cardiac history.  Patient denies any history of the same.  Denies any tenderness to the chest or recent heavy lifting.  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  EMS evaluated him with an EKG and recommended he come to the emergency room.  Patient is also currently being treated for bronchitis.  ROS: Chest pain (one) Vitals:   02/20/17 1508  BP: (!) 145/88  Pulse: 67  Resp: 16  Temp: 97.6 F (36.4 C)  SpO2: 97%     Physical Exam:   Gen: No distress  Neuro: Awake and Alert  Skin: Warm    Focused Exam: Cardiac regular rate and rhythm no murmurs     Initiation of care has begun. The patient has been counseled on the process, plan, and necessity for staying for the completion/evaluation, and the remainder of the medical screening examination   Francee Gentile 02/20/17 [place], Julie, MD 02/20/17 [phone]

## 2017-02-20 NOTE — Consult Note (Signed)
Cardiology Consultation:   Patient ID: Jon Ingram; [id number]; 02-04-1955   Admit date: 02/20/2017 Date of Consult: 02/20/2017  Primary Care Provider: Raeanne Gathers, MD Primary Cardiologist: n/a Primary Electrophysiologist:  n/a   Patient Profile:   Jon Ingram is a 62 y.o. male with a hx of HTN who is being seen today for the evaluation of chest pain and troponin elevation at the request of Dr Tomi Bamberger.  History of Present Illness:   Jon Ingram 62 yo male history of HTN presents with chest pain. He reports a 3 week history of ongoing SOB and productive cough. Can have severe coughing spells at times where he feels lightheaded. Reports episode of chest pain today around 1pm while standing at rest. 6/10 pressing feeling in midsternum radiating to left and right side, felt nauseous. Not positional. Lasted about 30 minutes and then resolved on its own.     K 4, Cr 1.08, Hgb 15.6, WBC 10.5  istat trop-->0.03-->0.09 EKG NSR CXR possible pneumonia Past Medical History:  Diagnosis Date  . Hypertension     Past Surgical History:  Procedure Laterality Date  . APPENDECTOMY    . CHOLECYSTECTOMY        Inpatient Medications: Scheduled Meds:  Continuous Infusions:  PRN Meds:   Allergies:   No Known Allergies  Social History:   Social History   Socioeconomic History  . Marital status: Married    Spouse name: Not on file  . Number of children: Not on file  . Years of education: Not on file  . Highest education level: Not on file  Social Needs  . Financial resource strain: Not on file  . Food insecurity - worry: Not on file  . Food insecurity - inability: Not on file  . Transportation needs - medical: Not on file  . Transportation needs - non-medical: Not on file  Occupational History  . Not on file  Tobacco Use  . Smoking status: Never Smoker  . Smokeless tobacco: Never Used  Substance and Sexual Activity  . Alcohol use: Yes  . Drug use: No  . Sexual  activity: Not on file  Other Topics Concern  . Not on file  Social History Narrative  . Not on file    Family History:   No family history on file.   ROS:  Please see the history of present illness.  ROS  All other ROS reviewed and negative.     Physical Exam/Data:   Vitals:   02/20/17 1616 02/20/17 1700 02/20/17 1834 02/20/17 1835  BP: (!) 150/83 (!) 144/88 (!) 158/89   Pulse: (!) 59 (!) 58  (!) 58  Resp: 16 20    Temp:      TempSrc:      SpO2: 97% 95%  97%    Intake/Output Summary (Last 24 hours) at 02/20/2017 1913 Last data filed at 02/20/2017 1902 Gross per 24 hour  Intake 50 ml  Output -  Net 50 ml    General:  Well nourished, well developed, in no acute distress HEENT: normal Lymph: no adenopathy Neck: no JVD Endocrine:  No thryomegaly Cardiac:  normal S1, S2; RRR; no murmur Lungs:  clear to auscultation bilaterally, no wheezing, rhonchi or rales  Abd: soft, nontender, no hepatomegaly  Ext: no edema Musculoskeletal:  No deformities, BUE and BLE strength normal and equal Skin: warm and dry  Neuro:  CNs 2-12 intact, no focal abnormalities noted Psych:  Normal affect       Laboratory Data:  Chemistry Recent Labs  Lab 02/20/17 1514  NA 137  K 4.0  CL 102  CO2 22  GLUCOSE 105*  BUN 20  CREATININE 1.08  CALCIUM 9.3  GFRNONAA >60  GFRAA >60  ANIONGAP 13    No results for input(s): PROT, ALBUMIN, AST, ALT, ALKPHOS, BILITOT in the last 168 hours. Hematology Recent Labs  Lab 02/20/17 1514  WBC 10.5  RBC 5.02  HGB 15.6  HCT 44.6  MCV 88.8  MCH 31.1  MCHC 35.0  RDW 13.2  PLT 227   Cardiac EnzymesNo results for input(s): TROPONINI in the last 168 hours.  Recent Labs  Lab 02/20/17 1525 02/20/17 1839  TROPIPOC 0.03 0.09*    BNPNo results for input(s): BNP, PROBNP in the last 168 hours.  DDimer No results for input(s): DDIMER in the last 168 hours.  Radiology/Studies:  Dg Chest 2 View  Result Date: 02/20/2017 CLINICAL DATA:  Chest  pain. EXAM: CHEST  2 VIEW COMPARISON:  No recent prior. FINDINGS: Mediastinum and hilar structures normal. Heart size normal. Mild infiltrate noted over the anterior lungs on lateral view. Mild right middle lobe and/or lingular pneumonia cannot be excluded. No pleural effusion or pneumothorax. IMPRESSION: Mild infiltrate noted the anterior chest on lateral view. Mild right middle lobe and/or lingular pneumonia cannot be excluded. Electronically Signed   By: Marcello Moores  Register   On: 02/20/2017 15:57    Assessment and Plan:   1. Chest pain/elevated troponin - very mild troponin elevation in setting of probable pneumonia, initial EKG is nonischemic - would monitor overnight on observation, cycle enzymes and EKG, echo tomorrow AM - possible mild demand ischemia in setting of pneumonia would be most likely cause. If significant trop elevation, EKG change, or abnormal echo may warrant ischemic evaluation - please make npo tonight in case further testing indicated.   2. Possible pneumonia - symptoms and CXR consistent with pneumonia.  - ongoing x 3 weeks, has been on azitrho. May require broader coverage, defer to primar team.    For questions or updates, please contact [place] Please consult [email or web address] for contact info under Cardiology/STEMI.   Merrily Pew, MD  02/20/2017 7:13 PM

## 2017-02-20 NOTE — ED Triage Notes (Signed)
Pt reports sudden onset of anterior chest pain. Denies sob or diaphoresis. Endorses nausea.

## 2017-02-20 NOTE — H&P (Signed)
History and Physical    Jon Ingram:[id number] DOB: Dec 05, 1955 DOA: 02/20/2017  Referring MD/NP/PA:   PCP: Raeanne Gathers, MD   Patient coming from:  The patient is coming from home.  At baseline, pt is independent for most of ADL.   Chief Complaint: chest pain and cough  HPI: Jon Ingram is a 61 y.o. male with medical history significant of hypertension, who presents with chest pain and cough.  Patient states that he has been having cough for almost 3 weeks. He was diagnosed as bronchitis and sinusitis by PCP. He has been treated with azithromycin and prednisone. He completed 5-day course of Z-Pak yesterday. Patient continues to have cough with clear mucus production. Denies fever or chills. No shortness of breath. Patient states that he had one episode of chest pain today. It is located in the frontal chest, pressure-like, 6 out of 10 in severity, nonradiating. He described that his chest pain "moved up and down in the front chest". It lasted for about 35 minutes, then resolved spontaneously. Patient has nausea, but no vomiting, diarrhea or abdominal pain. No symptoms of UTI. No unilateral weakness. Patient states that he has been taking Bourbon, 4-5 ounce each night. 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 He dose not have tenderness in the calf areas. Per report, pt was hypertensive with SBP of 170s.  ED Course: pt was found to have troponin 0.03, 0.09, WBC 10.5, electrolytes renal function okay, temperature normal, bradycardia, oxygen saturation 95% on room air. Pt is place on tele bed for obs. Dr. Harl Bowie of Card was consulted by EDP.  Chest x-ray showed: Mild infiltrate noted the anterior chest on lateral view. Mild right middle lobe and/or lingular pneumonia cannot be excluded.  Review of Systems:   General: no fevers, chills, no body weight gain, has fatigue HEENT: no blurry vision, hearing changes or sore throat Respiratory:  no dyspnea,  has coughing, no wheezing CV: has chest pain, no palpitations GI: has nausea, no vomiting, abdominal pain, diarrhea, constipation GU: no dysuria, burning on urination, increased urinary frequency, hematuria  Ext: no leg edema Neuro: no unilateral weakness, numbness, or tingling, no vision change or hearing loss Skin: no rash, no skin tear. MSK: No muscle spasm, no deformity, no limitation of range of movement in spin Heme: No easy bruising.  Travel history: No recent long distant travel.  Allergy: No Known Allergies  Past Medical History:  Diagnosis Date  . Hypertension     Past Surgical History:  Procedure Laterality Date  . APPENDECTOMY    . CHOLECYSTECTOMY      Social History:  reports that  has never smoked. he has never used smokeless tobacco. He reports that he drinks alcohol. He reports that he does not use drugs.  Family History:  Family History  Problem Relation Age of Onset  . Hypertension Mother   . Brain cancer Father   . Hypertension Brother      Prior to Admission medications   Medication Sig Start Date End Date Taking? Authorizing Provider  benzonatate (TESSALON) 100 MG capsule Take 100 mg by mouth 3 (three) times daily as needed for cough.   Yes [provider]  Cyanocobalamin (VITAMIN B 12 PO) Take 500 mg by mouth daily.   Yes [provider]  ibuprofen (ADVIL,MOTRIN) 200 MG tablet Take 200 mg by mouth every 6 (six) hours as needed for mild pain.    Yes [provider]  lisinopril (PRINIVIL,ZESTRIL) 2.5 MG tablet Take 2.5 mg by mouth daily.    Yes [provider]  predniSONE (DELTASONE) 20 MG tablet Take 20 mg by mouth 2 (two) times daily with a meal. x7 days.   Yes [provider]  azithromycin (ZITHROMAX) 250 MG tablet Take 250 mg by mouth daily.    [provider]    Physical Exam: Vitals:   02/20/17 1616 02/20/17 1700 02/20/17 1834 02/20/17 1835  BP: (!) 150/83 (!) 144/88 (!) 158/89    Pulse: (!) 59 (!) 58  (!) 58  Resp: 16 20    Temp:      TempSrc:      SpO2: 97% 95%  97%   General: Not in acute distress HEENT:       Eyes: PERRL, EOMI, no scleral icterus.       ENT: No discharge from the ears and nose, no pharynx injection, no tonsillar enlargement.        Neck: No JVD, no bruit, no mass felt. Heme: No neck lymph node enlargement. Cardiac: S1/S2, RRR, No murmurs, No gallops or rubs. Respiratory: No rales, wheezing, rhonchi or rubs. GI: Soft, nondistended, nontender, no rebound pain, no organomegaly, BS present. GU: No hematuria Ext: No pitting leg edema bilaterally. 2+DP/PT pulse bilaterally. Musculoskeletal: No joint deformities, No joint redness or warmth, no limitation of ROM in spin. Skin: No rashes.  Neuro: Alert, oriented X3, cranial nerves II-XII grossly intact, moves all extremities normally.  Psych: Patient is not psychotic, no suicidal or hemocidal ideation.  Labs on Admission: I have personally reviewed following labs and imaging studies  CBC: Recent Labs  Lab 02/20/17 1514  WBC 10.5  HGB 15.6  HCT 44.6  MCV 88.8  PLT 664   Basic Metabolic Panel: Recent Labs  Lab 02/20/17 1514  NA 137  K 4.0  CL 102  CO2 22  GLUCOSE 105*  BUN 20  CREATININE 1.08  CALCIUM 9.3   GFR: CrCl cannot be calculated (Unknown ideal weight.). Liver Function Tests: No results for input(s): AST, ALT, ALKPHOS, BILITOT, PROT, ALBUMIN in the last 168 hours. No results for input(s): LIPASE, AMYLASE in the last 168 hours. No results for input(s): AMMONIA in the last 168 hours. Coagulation Profile: No results for input(s): INR, PROTIME in the last 168 hours. Cardiac Enzymes: No results for input(s): CKTOTAL, CKMB, CKMBINDEX, TROPONINI in the last 168 hours. BNP (last 3 results) No results for input(s): PROBNP in the last 8760 hours. HbA1C: No results for input(s): HGBA1C in the last 72 hours. CBG: No results for input(s): GLUCAP in the last 168 hours. Lipid  Profile: No results for input(s): CHOL, HDL, LDLCALC, TRIG, CHOLHDL, LDLDIRECT in the last 72 hours. Thyroid Function Tests: No results for input(s): TSH, T4TOTAL, FREET4, T3FREE, THYROIDAB in the last 72 hours. Anemia Panel: No results for input(s): VITAMINB12, FOLATE, FERRITIN, TIBC, IRON, RETICCTPCT in the last 72 hours. Urine analysis: No results found for: COLORURINE, APPEARANCEUR, LABSPEC, PHURINE, GLUCOSEU, HGBUR, BILIRUBINUR, KETONESUR, PROTEINUR, UROBILINOGEN, NITRITE, LEUKOCYTESUR Sepsis Labs: @LABRCNTIP (procalcitonin:4,lacticidven:4) )No results found for this or any previous visit (from the past 240 hour(s)).   Radiological Exams on Admission: Dg Chest 2 View  Result Date: 02/20/2017 CLINICAL DATA:  Chest pain. EXAM: CHEST  2 VIEW COMPARISON:  No recent prior. FINDINGS: Mediastinum and hilar structures normal. Heart size normal. Mild infiltrate noted over the anterior lungs on lateral view. Mild right middle lobe and/or lingular pneumonia cannot be excluded. No pleural effusion or pneumothorax.  IMPRESSION: Mild infiltrate noted the anterior chest on lateral view. Mild right middle lobe and/or lingular pneumonia cannot be excluded. Electronically Signed   By: Marcello Moores  Register   On: 02/20/2017 15:57     EKG: Independently reviewed. Sinus rhythm, QTC 421, LAD, nonspecific T-wave change.    Assessment/Plan Principal Problem:   Chest pain Active Problems:   Hypertension   Lobar pneumonia (HCC)   Alcohol use   Elevated troponin   Chest pain and elevated trop: trop 0.03-->0.09. Dr. Harl Bowie of Card was consulted. Per Dr. Harl Bowie, "possible mild demand ischemia in setting of pneumonia would be most likely cause". He recommended to cycle enzymes and EKG, echo tomorrow AM. 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 Will keep this risk factor  in mind. If patient develops shortness of breath or tenderness in the calf areas, will do workup for PE.  - will place on Tele bed for obs - highly appreciate Dr.Branch's recommendation - cycle CE q6 x3 and repeat EKG in the am  - prn Nitroglycerin, Morphine, and aspirin - will start lipitor, 40 mg daily - Risk factor stratification: will check FLP, UDS and A1C  - 2d echo  Possible lobar pneumonia: Patient has been treated with Z-Pak and prednisone by PCP for bronchitis and sinusitis. Chest x-ray showed infiltration. Pt still has cough. His chest pain could also be related. Will broaden the abx treatment. Pt is on prednisone (2 days left for 7 days course). - IV Rocephin and doxycycline - prn Tesselon for cough  - prn Albuterol Nebs for SOB - Urine legionella and S. pneumococcal antigen - Follow up blood culture x2, sputum culture and plus Flu pcr  HTN:  -Continue home medications: Lisinopril -IV hydralazine prn  Alcohol use: currently no signs of witchdraw -put pt on CIWA   DVT ppx: SQ Lovenox Code Status: Full code Family Communication: Yes, patient's wife at bed side Disposition Plan:  Anticipate discharge back to previous home environment Consults called:  Dr. Harl Bowie of card Admission status: Obs / tele    Date of Service 02/20/2017    Ivor Costa [facility] Pager [phone]  If 7PM-7AM, please contact night-coverage [email or web address] Password [facility] 02/20/2017, 8:15 PM

## 2017-02-20 NOTE — Plan of Care (Signed)
Patient without chest pain tonight

## 2017-02-21 ENCOUNTER — Observation Stay (HOSPITAL_BASED_OUTPATIENT_CLINIC_OR_DEPARTMENT_OTHER): Payer: TRICARE For Life (TFL)

## 2017-02-21 DIAGNOSIS — R748 Abnormal levels of other serum enzymes: Secondary | ICD-10-CM

## 2017-02-21 DIAGNOSIS — R079 Chest pain, unspecified: Secondary | ICD-10-CM | POA: Diagnosis not present

## 2017-02-21 DIAGNOSIS — F1099 Alcohol use, unspecified with unspecified alcohol-induced disorder: Secondary | ICD-10-CM | POA: Diagnosis not present

## 2017-02-21 DIAGNOSIS — J181 Lobar pneumonia, unspecified organism: Secondary | ICD-10-CM | POA: Diagnosis not present

## 2017-02-21 DIAGNOSIS — I10 Essential (primary) hypertension: Secondary | ICD-10-CM | POA: Diagnosis not present

## 2017-02-21 DIAGNOSIS — Z789 Other specified health status: Secondary | ICD-10-CM

## 2017-02-21 LAB — RAPID URINE DRUG SCREEN, HOSP PERFORMED
AMPHETAMINES: NOT DETECTED
BENZODIAZEPINES: NOT DETECTED
Barbiturates: NOT DETECTED
Cocaine: NOT DETECTED
OPIATES: NOT DETECTED
Tetrahydrocannabinol: NOT DETECTED

## 2017-02-21 LAB — LIPID PANEL
CHOL/HDL RATIO: 4.3 ratio
Cholesterol: 153 mg/dL (ref 0–200)
HDL: 36 mg/dL — AB (ref >40)
LDL CALC: 77 mg/dL (ref 0–99)
TRIGLYCERIDES: 199 mg/dL — AB (ref <150)
VLDL: 40 mg/dL (ref 0–40)

## 2017-02-21 LAB — ECHOCARDIOGRAM COMPLETE: Weight: 3644.8 oz

## 2017-02-21 LAB — HEMOGLOBIN A1C
Hgb A1c MFr Bld: 5 % (ref 4.8–5.6)
MEAN PLASMA GLUCOSE: 96.8 mg/dL

## 2017-02-21 LAB — STREP PNEUMONIAE URINARY ANTIGEN: Strep Pneumo Urinary Antigen: NEGATIVE

## 2017-02-21 LAB — INFLUENZA PANEL BY PCR (TYPE A & B)
INFLAPCR: NEGATIVE
INFLBPCR: NEGATIVE

## 2017-02-21 LAB — TROPONIN I
Troponin I: 0.05 ng/mL (ref <0.03)
Troponin I: <0.03 ng/mL (ref <0.03)

## 2017-02-21 LAB — HIV ANTIBODY (ROUTINE TESTING W REFLEX): HIV Screen 4th Generation wRfx: NONREACTIVE

## 2017-02-21 MED ORDER — AMOXICILLIN-POT CLAVULANATE 875-125 MG PO TABS: 1.0000 | ORAL_TABLET | Freq: Two times a day (BID) | ORAL | 0 refills | Status: AC

## 2017-02-21 MED ORDER — ATORVASTATIN CALCIUM 40 MG PO TABS: 40.0000 mg | ORAL_TABLET | Freq: Every day | ORAL | 0 refills | Status: AC

## 2017-02-21 NOTE — Discharge Summary (Signed)
Physician Discharge Summary  Jon Ingram SWH:[id number] DOB: Jun 11, 1955 DOA: 02/20/2017  PCP: Raeanne Gathers, MD  Admit date: 02/20/2017 Discharge date: 02/21/2017   Recommendations for Outpatient Follow-Up:   1. Outpatient stress test 2. abx changed   Discharge Diagnosis:   Principal Problem:   Chest pain Active Problems:   Hypertension   Lobar pneumonia (HCC)   Alcohol use   Elevated troponin   Discharge disposition:  Home.  Discharge Condition: Improved.  Diet recommendation: Low sodium, heart healthy.  Wound care: None.   History of Present Illness:  Jon Ingram is a 62 y.o. male with medical history significant of hypertension, who presents with chest pain and cough.  Patient states that he has been having cough for almost 3 weeks. He was diagnosed as bronchitis and sinusitis by PCP. He has been treated with azithromycin and prednisone. He completed 5-day course of Z-Pak yesterday. Patient continues to have cough with clear mucus production. Denies fever or chills. No shortness of breath. Patient states that he had one episode of chest pain today. It is located in the frontal chest, pressure-like, 6 out of 10 in severity, nonradiating. He described that his chest pain "moved up and down in the front chest". It lasted for about 35 minutes, then resolved spontaneously. Patient has nausea, but no vomiting, diarrhea or abdominal pain. No symptoms of UTI. No unilateral weakness. Patient states that he has been taking Bourbon, 4-5 ounce each night. 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 He dose not have tenderness in the calf areas. Per report, pt was hypertensive with SBP of 170s.     Hospital Course by Problem:   Elevated troponin -suspect due to PNA -seen by cards-- plan for outpatient stress test by cards -echo:- Left ventricle: The cavity size was normal. Wall thickness was   normal. Systolic function was  normal. The estimated ejection   fraction was in the range of 60% to 65%. Wall motion was normal;   there were no regional wall motion abnormalities. Doppler   parameters are consistent with abnormal left ventricular   relaxation (grade 1 diastolic dysfunction).  CAP -treat with PO augmentin  Alcohol use -encourage cessation  HTN -resume home meds   Medical Consultants:    cardiology   Discharge Exam:   Vitals:   02/21/17 1155 02/21/17 1619  BP: 139/80 (!) 150/77  Pulse: 64 62  Resp: 20 20  Temp: 98 F (36.7 C) 98.6 F (37 C)  SpO2: 97% 96%   Vitals:   02/21/17 0625 02/21/17 0810 02/21/17 1155 02/21/17 1619  BP: 125/81 133/89 139/80 (!) 150/77  Pulse: (!) 56 (!) 55 64 62  Resp:  20 20 20   Temp: 97.7 F (36.5 C) 97.9 F (36.6 C) 98 F (36.7 C) 98.6 F (37 C)  TempSrc: Oral Oral Oral Oral  SpO2: 98% 95% 97% 96%  Weight:        Gen:  NAD    The results of significant diagnostics from this hospitalization (including imaging, microbiology, ancillary and laboratory) are listed below for reference.     Procedures and Diagnostic Studies:   Dg Chest 2 View  Result Date: 02/20/2017 CLINICAL DATA:  Chest pain. EXAM: CHEST  2 VIEW COMPARISON:  No recent prior. FINDINGS: Mediastinum and hilar structures normal. Heart size normal. Mild infiltrate noted over the anterior lungs on lateral view. Mild right middle lobe and/or lingular pneumonia cannot be excluded. No pleural  effusion or pneumothorax. IMPRESSION: Mild infiltrate noted the anterior chest on lateral view. Mild right middle lobe and/or lingular pneumonia cannot be excluded. Electronically Signed   By: Marcello Moores  Register   On: 02/20/2017 15:57     Labs:   Basic Metabolic Panel: Recent Labs  Lab 02/20/17 1514  NA 137  K 4.0  CL 102  CO2 22  GLUCOSE 105*  BUN 20  CREATININE 1.08  CALCIUM 9.3   GFR Estimated Creatinine Clearance: 80.2 mL/min (by C-G formula based on SCr of 1.08 mg/dL). Liver  Function Tests: No results for input(s): AST, ALT, ALKPHOS, BILITOT, PROT, ALBUMIN in the last 168 hours. No results for input(s): LIPASE, AMYLASE in the last 168 hours. No results for input(s): AMMONIA in the last 168 hours. Coagulation profile No results for input(s): INR, PROTIME in the last 168 hours.  CBC: Recent Labs  Lab 02/20/17 1514  WBC 10.5  HGB 15.6  HCT 44.6  MCV 88.8  PLT 227   Cardiac Enzymes: Recent Labs  Lab 02/20/17 2103 02/21/17 0209 02/21/17 0703  TROPONINI 0.08* 0.05* <0.03   BNP: Invalid input(s): POCBNP CBG: No results for input(s): GLUCAP in the last 168 hours. D-Dimer No results for input(s): DDIMER in the last 72 hours. Hgb A1c Recent Labs    02/21/17 0209  HGBA1C 5.0   Lipid Profile Recent Labs    02/21/17 0209  CHOL 153  HDL 36*  LDLCALC 77  TRIG 199*  CHOLHDL 4.3   Thyroid function studies No results for input(s): TSH, T4TOTAL, T3FREE, THYROIDAB in the last 72 hours.  Invalid input(s): FREET3 Anemia work up No results for input(s): VITAMINB12, FOLATE, FERRITIN, TIBC, IRON, RETICCTPCT in the last 72 hours. Microbiology Recent Results (from the past 240 hour(s))  Culture, blood (routine x 2) Call MD if unable to obtain prior to antibiotics being given     Status: None (Preliminary result)   Collection Time: 02/20/17  9:03 PM  Result Value Ref Range Status   Specimen Description BLOOD RIGHT ANTECUBITAL  Final   Special Requests IN PEDIATRIC BOTTLE Blood Culture adequate volume  Final   Culture NO GROWTH < 12 HOURS  Final   Report Status PENDING  Incomplete  Culture, blood (routine x 2) Call MD if unable to obtain prior to antibiotics being given     Status: None (Preliminary result)   Collection Time: 02/20/17  9:07 PM  Result Value Ref Range Status   Specimen Description BLOOD RIGHT HAND  Final   Special Requests IN PEDIATRIC BOTTLE Blood Culture adequate volume  Final   Culture NO GROWTH < 12 HOURS  Final   Report Status  PENDING  Incomplete     Discharge Instructions:   Discharge Instructions    Diet - low sodium heart healthy   Complete by:  As directed    Discharge instructions   Complete by:  As directed    Increase activity slowly   Complete by:  As directed      Allergies as of 02/21/2017   No Known Allergies     Medication List    STOP taking these medications   azithromycin 250 MG tablet Commonly known as:  ZITHROMAX   ibuprofen 200 MG tablet Commonly known as:  ADVIL,MOTRIN     TAKE these medications   amoxicillin-clavulanate 875-125 MG tablet Commonly known as:  AUGMENTIN Take 1 tablet by mouth 2 (two) times daily.   atorvastatin 40 MG tablet Commonly known as:  LIPITOR Take 1 tablet (  40 mg total) by mouth daily at 6 PM.   benzonatate 100 MG capsule Commonly known as:  TESSALON Take 100 mg by mouth 3 (three) times daily as needed for cough.   lisinopril 2.5 MG tablet Commonly known as:  PRINIVIL,ZESTRIL Take 2.5 mg by mouth daily.   predniSONE 20 MG tablet Commonly known as:  DELTASONE Take 20 mg by mouth 2 (two) times daily with a meal. x7 days.   VITAMIN B 12 PO Take 500 mg by mouth daily.      Follow-up Information    Raeanne Gathers, MD Follow up in 1 week(s).   Specialty:  Family Medicine Contact information: [address] [place] [address] [phone]        Pixie Casino, MD Follow up.   Specialty:  Cardiology Why:  for outpatient stress test Contact information: [address] [place] [address] [phone]            Time coordinating discharge: 35 min  Signed:  Geradine Girt   [facility] 02/21/2017, 4:29 PM

## 2017-02-21 NOTE — Progress Notes (Signed)
2D Echocardiogram has been performed.  Jon Ingram 02/21/2017, 3:25 PM

## 2017-02-21 NOTE — Progress Notes (Signed)
Followed up with microbiology concerning the influenza panel that has not resulted. It was received last night and will be resulted now.

## 2017-02-21 NOTE — Progress Notes (Signed)
DAILY PROGRESS NOTE   Patient Name: Jon Ingram Date of Encounter: 02/21/2017  Chief Complaint   No chest pain or dyspnea  Patient Profile   BARTLETT ENKE is a 62 y.o. male with a hx of HTN who is being seen today for the evaluation of chest pain and troponin elevation at the request of Dr Tomi Bamberger.  Subjective   Troponin stable and mildly elevated overnight in the setting of pneumonia. Echo is pending. Denies chest pain today.   Objective   Vitals:   02/20/17 2100 02/21/17 0036 02/21/17 0625 02/21/17 0810  BP: (!) 156/91 137/66 125/81 133/89  Pulse:  61 (!) 56 (!) 55  Resp:  18  20  Temp:  98.4 F (36.9 C) 97.7 F (36.5 C) 97.9 F (36.6 C)  TempSrc:  Oral Oral Oral  SpO2:  96% 98% 95%  Weight:        Intake/Output Summary (Last 24 hours) at 02/21/2017 0957 Last data filed at 02/21/2017 3536 Gross per 24 hour  Intake 1416.67 ml  Output -  Net 1416.67 ml   Filed Weights   02/20/17 2059  Weight: 227 lb 12.8 oz (103.3 kg)    Physical Exam   General appearance: alert and no distress Lungs: clear to auscultation bilaterally Heart: regular rate and rhythm Extremities: extremities normal, atraumatic, no cyanosis or edema Neurologic: Grossly normal  Inpatient Medications    Scheduled Meds: . aspirin  324 mg Oral Daily  . atorvastatin  40 mg Oral q1800  . doxycycline  100 mg Oral Q12H  . enoxaparin (LOVENOX) injection  40 mg Subcutaneous Q24H  . folic acid  1 mg Oral Daily  . lisinopril  2.5 mg Oral Daily  . LORazepam  0-4 mg Intravenous Q6H   Followed by  . [START ON 02/22/2017] LORazepam  0-4 mg Intravenous Q12H  . multivitamin with minerals  1 tablet Oral Daily  . predniSONE  20 mg Oral BID WC  . thiamine  100 mg Oral Daily   Or  . thiamine  100 mg Intravenous Daily  . vitamin B-12  500 mcg Oral Daily    Continuous Infusions: . sodium chloride 100 mL/hr at 02/21/17 0644  . cefTRIAXone (ROCEPHIN)  IV      PRN Meds: acetaminophen, albuterol,  alum & mag hydroxide-simeth, benzonatate, hydrALAZINE, LORazepam **OR** LORazepam, morphine injection, nitroGLYCERIN, ondansetron (ZOFRAN) IV, zolpidem   Labs   Results for orders placed or performed during the hospital encounter of 02/20/17 (from the past 48 hour(s))  Basic metabolic panel     Status: Abnormal   Collection Time: 02/20/17  3:14 PM  Result Value Ref Range   Sodium 137 135 - 145 mmol/L   Potassium 4.0 3.5 - 5.1 mmol/L   Chloride 102 101 - 111 mmol/L   CO2 22 22 - 32 mmol/L   Glucose, Bld 105 (H) 65 - 99 mg/dL   BUN 20 6 - 20 mg/dL   Creatinine, Ser 1.08 0.61 - 1.24 mg/dL   Calcium 9.3 8.9 - 10.3 mg/dL   GFR calc non Af Amer >60 >60 mL/min   GFR calc Af Amer >60 >60 mL/min    Comment: (NOTE) The eGFR has been calculated using the CKD EPI equation. This calculation has not been validated in all clinical situations. eGFR's persistently <60 mL/min signify possible Chronic Kidney Disease.    Anion gap 13 5 - 15  CBC     Status: None   Collection Time: 02/20/17  3:14 PM  Result Value Ref Range   WBC 10.5 4.0 - 10.5 K/uL   RBC 5.02 4.22 - 5.81 MIL/uL   Hemoglobin 15.6 13.0 - 17.0 g/dL   HCT 44.6 39.0 - 52.0 %   MCV 88.8 78.0 - 100.0 fL   MCH 31.1 26.0 - 34.0 pg   MCHC 35.0 30.0 - 36.0 g/dL   RDW 13.2 11.5 - 15.5 %   Platelets 227 150 - 400 K/uL  I-stat troponin, ED     Status: None   Collection Time: 02/20/17  3:25 PM  Result Value Ref Range   Troponin i, poc 0.03 0.00 - 0.08 ng/mL   Comment 3            Comment: Due to the release kinetics of cTnI, a negative result within the first hours of the onset of symptoms does not rule out myocardial infarction with certainty. If myocardial infarction is still suspected, repeat the test at appropriate intervals.   I-stat troponin, ED     Status: Abnormal   Collection Time: 02/20/17  6:39 PM  Result Value Ref Range   Troponin i, poc 0.09 (HH) 0.00 - 0.08 ng/mL   Comment NOTIFIED PHYSICIAN    Comment 3             Comment: Due to the release kinetics of cTnI, a negative result within the first hours of the onset of symptoms does not rule out myocardial infarction with certainty. If myocardial infarction is still suspected, repeat the test at appropriate intervals.   Troponin I (q 6hr x 3)     Status: Abnormal   Collection Time: 02/20/17  9:03 PM  Result Value Ref Range   Troponin I 0.08 (HH) <0.03 ng/mL    Comment: CRITICAL RESULT CALLED TO, READ BACK BY AND VERIFIED WITH: C HALL,RN 2232 02/20/2017 WBOND   Culture, blood (routine x 2) Call MD if unable to obtain prior to antibiotics being given     Status: None (Preliminary result)   Collection Time: 02/20/17  9:03 PM  Result Value Ref Range   Specimen Description BLOOD RIGHT ANTECUBITAL    Special Requests IN PEDIATRIC BOTTLE Blood Culture adequate volume    Culture NO GROWTH < 12 HOURS    Report Status PENDING   Culture, blood (routine x 2) Call MD if unable to obtain prior to antibiotics being given     Status: None (Preliminary result)   Collection Time: 02/20/17  9:07 PM  Result Value Ref Range   Specimen Description BLOOD RIGHT HAND    Special Requests IN PEDIATRIC BOTTLE Blood Culture adequate volume    Culture NO GROWTH < 12 HOURS    Report Status PENDING   Rapid urine drug screen (hospital performed)     Status: None   Collection Time: 02/20/17 10:56 PM  Result Value Ref Range   Opiates NONE DETECTED NONE DETECTED   Cocaine NONE DETECTED NONE DETECTED   Benzodiazepines NONE DETECTED NONE DETECTED   Amphetamines NONE DETECTED NONE DETECTED   Tetrahydrocannabinol NONE DETECTED NONE DETECTED   Barbiturates NONE DETECTED NONE DETECTED    Comment: (NOTE) DRUG SCREEN FOR MEDICAL PURPOSES ONLY.  IF CONFIRMATION IS NEEDED FOR ANY PURPOSE, NOTIFY LAB WITHIN 5 DAYS. LOWEST DETECTABLE LIMITS FOR URINE DRUG SCREEN Drug Class                     Cutoff (ng/mL) Amphetamine and metabolites    1000 Barbiturate and metabolites     200 Benzodiazepine  916 Tricyclics and metabolites     300 Opiates and metabolites        300 Cocaine and metabolites        300 THC                            50   Strep pneumoniae urinary antigen     Status: None   Collection Time: 02/20/17 10:56 PM  Result Value Ref Range   Strep Pneumo Urinary Antigen NEGATIVE NEGATIVE    Comment:        Infection due to S. pneumoniae cannot be absolutely ruled out since the antigen present may be below the detection limit of the test.   Hemoglobin A1c     Status: None   Collection Time: 02/21/17  2:09 AM  Result Value Ref Range   Hgb A1c MFr Bld 5.0 4.8 - 5.6 %    Comment: (NOTE) Pre diabetes:          5.7%-6.4% Diabetes:              >6.4% Glycemic control for   <7.0% adults with diabetes    Mean Plasma Glucose 96.8 mg/dL  Lipid panel     Status: Abnormal   Collection Time: 02/21/17  2:09 AM  Result Value Ref Range   Cholesterol 153 0 - 200 mg/dL   Triglycerides 199 (H) <150 mg/dL   HDL 36 (L) >40 mg/dL   Total CHOL/HDL Ratio 4.3 RATIO   VLDL 40 0 - 40 mg/dL   LDL Cholesterol 77 0 - 99 mg/dL    Comment:        Total Cholesterol/HDL:CHD Risk Coronary Heart Disease Risk Table                     Men   Women  1/2 Average Risk   3.4   3.3  Average Risk       5.0   4.4  2 X Average Risk   9.6   7.1  3 X Average Risk  23.4   11.0        Use the calculated Patient Ratio above and the CHD Risk Table to determine the patient's CHD Risk.        ATP III CLASSIFICATION (LDL):  <100     mg/dL   Optimal  100-129  mg/dL   Near or Above                    Optimal  130-159  mg/dL   Borderline  160-189  mg/dL   High  >190     mg/dL   Very High   Troponin I (q 6hr x 3)     Status: Abnormal   Collection Time: 02/21/17  2:09 AM  Result Value Ref Range   Troponin I 0.05 (HH) <0.03 ng/mL    Comment: CRITICAL RESULT CALLED TO, READ BACK BY AND VERIFIED WITH: TO EMUKA(RN) BY TCLEVELAND 02/21/2017 AT 3:35AM   Troponin I (q  6hr x 3)     Status: None   Collection Time: 02/21/17  7:03 AM  Result Value Ref Range   Troponin I <0.03 <0.03 ng/mL    ECG   Sinus brady at 56, no ischemia - Personally Reviewed  Telemetry   Sinus brady in the 50's - Personally Reviewed  Radiology    Dg Chest 2 View  Result Date: 02/20/2017 CLINICAL DATA:  Chest pain. EXAM:  CHEST  2 VIEW COMPARISON:  No recent prior. FINDINGS: Mediastinum and hilar structures normal. Heart size normal. Mild infiltrate noted over the anterior lungs on lateral view. Mild right middle lobe and/or lingular pneumonia cannot be excluded. No pleural effusion or pneumothorax. IMPRESSION: Mild infiltrate noted the anterior chest on lateral view. Mild right middle lobe and/or lingular pneumonia cannot be excluded. Electronically Signed   By: Marcello Moores  Register   On: 02/20/2017 15:57    Cardiac Studies   N/A  Assessment   1. Principal Problem: 2.   Chest pain 3. Active Problems: 4.   Hypertension 5.   Lobar pneumonia ([place]) 6.   Alcohol use 7.   Elevated troponin 8.   Plan   1. No further chest pain. Troponin mildly elevated - may be non-specific related to pneumonia. Awaiting echo - if this is fairly normal, then could consider outpatient stress testing. LDL 77.  Time Spent Directly with Patient:  I have spent a total of 25 minutes with the patient reviewing hospital notes, telemetry, EKGs, labs and examining the patient as well as establishing an assessment and plan that was discussed personally with the patient. > 50% of time was spent in direct patient care.  Length of Stay:  LOS: 0 days   Pixie Casino, MD, [facility], [place] Director of the Advanced Lipid Disorders &  Cardiovascular Risk Reduction Clinic Diplomate of the American Board of Clinical Lipidology Attending Cardiologist  Direct Dial: [phone]  Fax: [phone]  Website:  [email or web address] 02/21/2017, 9:57 AM

## 2017-02-22 DIAGNOSIS — R079 Chest pain, unspecified: Secondary | ICD-10-CM

## 2017-02-22 LAB — LEGIONELLA PNEUMOPHILA SEROGP 1 UR AG: L. PNEUMOPHILA SEROGP 1 UR AG: NEGATIVE

## 2017-02-22 NOTE — Progress Notes (Signed)
Echo reviewed- no significant abnormal findings. Ok to d/c from our standpoint. Can follow-up to consider outpatient stress testing.  Pixie Casino, MD, [facility], [place] Director of the Advanced Lipid Disorders &  Cardiovascular Risk Reduction Clinic Diplomate of the American Board of Clinical Lipidology Attending Cardiologist  Direct Dial: [phone]  Fax: [phone]  Website:  [email or web address]

## 2017-02-25 LAB — CULTURE, BLOOD (ROUTINE X 2)
CULTURE: NO GROWTH
Culture: NO GROWTH
Special Requests: ADEQUATE
Special Requests: ADEQUATE

## 2018-04-22 ENCOUNTER — Ambulatory Visit (INDEPENDENT_AMBULATORY_CARE_PROVIDER_SITE_OTHER): Payer: Medicare Other | Admitting: Podiatry

## 2018-04-22 ENCOUNTER — Other Ambulatory Visit: Payer: Self-pay | Admitting: Podiatry

## 2018-04-22 ENCOUNTER — Ambulatory Visit (INDEPENDENT_AMBULATORY_CARE_PROVIDER_SITE_OTHER): Payer: Medicare Other

## 2018-04-22 ENCOUNTER — Other Ambulatory Visit: Payer: Self-pay

## 2018-04-22 DIAGNOSIS — M79671 Pain in right foot: Secondary | ICD-10-CM

## 2018-04-22 DIAGNOSIS — M7751 Other enthesopathy of right foot: Secondary | ICD-10-CM

## 2018-04-22 DIAGNOSIS — M7700 Medial epicondylitis, unspecified elbow: Secondary | ICD-10-CM | POA: Insufficient documentation

## 2018-04-22 DIAGNOSIS — M2041 Other hammer toe(s) (acquired), right foot: Secondary | ICD-10-CM

## 2018-04-22 DIAGNOSIS — H9319 Tinnitus, unspecified ear: Secondary | ICD-10-CM | POA: Insufficient documentation

## 2018-04-22 DIAGNOSIS — D179 Benign lipomatous neoplasm, unspecified: Secondary | ICD-10-CM | POA: Insufficient documentation

## 2018-04-22 DIAGNOSIS — R1310 Dysphagia, unspecified: Secondary | ICD-10-CM | POA: Insufficient documentation

## 2018-04-22 DIAGNOSIS — E785 Hyperlipidemia, unspecified: Secondary | ICD-10-CM | POA: Insufficient documentation

## 2018-04-22 DIAGNOSIS — G4700 Insomnia, unspecified: Secondary | ICD-10-CM | POA: Insufficient documentation

## 2018-04-22 DIAGNOSIS — J309 Allergic rhinitis, unspecified: Secondary | ICD-10-CM | POA: Insufficient documentation

## 2018-04-22 NOTE — Patient Instructions (Signed)
Pre-Operative Instructions  Congratulations, you have decided to take an important step towards improving your quality of life.  You can be assured that the doctors and staff at [facility] will be with you every step of the way.  Here are some important things you should know:  1. Plan to be at the surgery center/hospital at least 1 (one) hour prior to your scheduled time, unless otherwise directed by the surgical center/hospital staff.  You must have a responsible adult accompany you, remain during the surgery and drive you home.  Make sure you have directions to the surgical center/hospital to ensure you arrive on time. 2. If you are having surgery at [facility] or  hospitals, you will need a copy of your medical history and physical form from your family physician within one month prior to the date of surgery. We will give you a form for your primary physician to complete.  3. We make every effort to accommodate the date you request for surgery.  However, there are times where surgery dates or times have to be moved.  We will contact you as soon as possible if a change in schedule is required.   4. No aspirin/ibuprofen for one week before surgery.  If you are on aspirin, any non-steroidal anti-inflammatory medications (Mobic, Aleve, Ibuprofen) should not be taken seven (7) days prior to your surgery.  You make take Tylenol for pain prior to surgery.  5. Medications - If you are taking daily heart and blood pressure medications, seizure, reflux, allergy, asthma, anxiety, pain or diabetes medications, make sure you notify the surgery center/hospital before the day of surgery so they can tell you which medications you should take or avoid the day of surgery. 6. No food or drink after midnight the night before surgery unless directed otherwise by surgical center/hospital staff. 7. No alcoholic beverages 24-hours prior to surgery.  No smoking 24-hours prior or 24-hours after  surgery. 8. Wear loose pants or shorts. They should be loose enough to fit over bandages, boots, and casts. 9. Don't wear slip-on shoes. Sneakers are preferred. 10. Bring your boot with you to the surgery center/hospital.  Also bring crutches or a walker if your physician has prescribed it for you.  If you do not have this equipment, it will be provided for you after surgery. 11. If you have not been contacted by the surgery center/hospital by the day before your surgery, call to confirm the date and time of your surgery. 12. Leave-time from work may vary depending on the type of surgery you have.  Appropriate arrangements should be made prior to surgery with your employer. 13. Prescriptions will be provided immediately following surgery by your doctor.  Fill these as soon as possible after surgery and take the medication as directed. Pain medications will not be refilled on weekends and must be approved by the doctor. 14. Remove nail polish on the operative foot and avoid getting pedicures prior to surgery. 15. Wash the night before surgery.  The night before surgery wash the foot and leg well with water and the antibacterial soap provided. Be sure to pay special attention to beneath the toenails and in between the toes.  Wash for at least three (3) minutes. Rinse thoroughly with water and dry well with a towel.  Perform this wash unless told not to do so by your physician.  Enclosed: 1 Ice pack (please put in freezer the night before surgery)   1 Hibiclens skin cleaner     Pre-op instructions  If you have any questions regarding the instructions, please do not hesitate to call our office.  [place]: [address], [place], [place] [address] -- [phone]  [place]: [address] [place], [place] [address] -- [phone]  [place]: [address]  [place], [place] [address] -- [phone]  [place]: [address], [address], [place], [place] [address] -- [phone]  Website:  [email or web address] 

## 2018-04-23 NOTE — Progress Notes (Signed)
   HPI: 63 year old male presenting today as a new patient, referred by Dr. Gershon Mussel, with a chief complaint of dorsal and plantar right foot pain that has been ongoing for the past three years. 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 Walking increases his pain. He has not had any recent treatment for his symptoms but has been taking OTC pain relievers with no significant relief. Patient is here for further evaluation and treatment.   Past Medical History:  Diagnosis Date  . Hypertension       Objective: Physical Exam General: The patient is alert and oriented x3 in no acute distress.  Dermatology: Skin is cool, dry and supple bilateral lower extremities. Negative for open lesions or macerations.  Vascular: Palpable pedal pulses bilaterally. No edema or erythema noted. Capillary refill within normal limits.  Neurological: Epicritic and protective threshold grossly intact bilaterally.   Musculoskeletal Exam: All pedal and ankle joints range of motion within normal limits bilateral. Muscle strength 5/5 in all groups bilateral. Hammertoe contracture deformity noted to the 2nd digit of the right foot. Pain with palpation noted to the 2nd MPJ of the right foot. Tarsal exostosis and an elongated 2nd metatarsal of the right foot noted.   Radiographic Exam: Hammertoe contracture deformity noted to the interphalangeal joints and MPJ of the respective hammertoe digits mentioned on clinical musculoskeletal exam. Tarsal exostosis of the right foot noted. Elongated 2nd metatarsal of the right foot noted.    Assessment: 1. 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 2nd MPJ capsulitis right foot 3. Tarsal exostosis right 4. Elongated 2nd metatarsal right    Plan of Care:  1. Patient evaluated. X-Rays reviewed.  2. Today we discussed the conservative versus surgical management of the presenting pathology. The patient opts for surgical management. All possible complications and  details of the procedure were explained. All patient questions were answered. No guarantees were expressed or implied. 3. Authorization for surgery was initiated today. Surgery will consist of DIPJ arthroplasty 2nd right; Weil osteotomy 2nd right; tarsal exostectomy right.  4. Return to clinic one week post op.     Edrick Kins, DPM [facility]  Dr. Edrick Kins, DPM    [address], [place] [address]                Office [phone]  Fax [phone]

## 2018-09-16 ENCOUNTER — Telehealth: Payer: Self-pay | Admitting: *Deleted

## 2018-09-16 NOTE — Telephone Encounter (Signed)
"  I had a consultation actually back in March about getting my foot worked on.  I want to call and see about getting something scheduled for the Fall.  Please give me a call back."

## 2018-09-18 NOTE — Telephone Encounter (Signed)
I am returning your call.  "I told I needed surgery when I came in there in March but due to all that is going on with Covid, I had to hold off.  I want to schedule that now."  Dr. Amalia Hailey' next available date is in October.  "I actually want to do it the last week of November."  Dr. Amalia Hailey can do it December 19, 2018 or January 02, 2019.  "Let's schedule it for December 3.  What time?"  Someone from the surgical center will call you a day or two prior to your surgery date and will give you your arrival time.  You do need to see Dr. Amalia Hailey for another consultation prior to having the surgery.  Would you like me to transfer you to an appointment scheduler so you can schedule that appointment?  "Yes, that will be fine."  I transferred him to [place].

## 2018-12-09 ENCOUNTER — Ambulatory Visit (INDEPENDENT_AMBULATORY_CARE_PROVIDER_SITE_OTHER): Payer: Medicare Other | Admitting: Podiatry

## 2018-12-09 ENCOUNTER — Other Ambulatory Visit: Payer: Self-pay

## 2018-12-09 ENCOUNTER — Ambulatory Visit (INDEPENDENT_AMBULATORY_CARE_PROVIDER_SITE_OTHER): Payer: Medicare Other

## 2018-12-09 DIAGNOSIS — M7751 Other enthesopathy of right foot: Secondary | ICD-10-CM | POA: Diagnosis not present

## 2018-12-09 DIAGNOSIS — S99911A Unspecified injury of right ankle, initial encounter: Secondary | ICD-10-CM

## 2018-12-09 DIAGNOSIS — S99919A Unspecified injury of unspecified ankle, initial encounter: Secondary | ICD-10-CM

## 2018-12-09 DIAGNOSIS — M2041 Other hammer toe(s) (acquired), right foot: Secondary | ICD-10-CM

## 2018-12-09 NOTE — Patient Instructions (Signed)
Pre-Operative Instructions  Congratulations, you have decided to take an important step towards improving your quality of life.  You can be assured that the doctors and staff at [facility] will be with you every step of the way.  Here are some important things you should know:  1. Plan to be at the surgery center/hospital at least 1 (one) hour prior to your scheduled time, unless otherwise directed by the surgical center/hospital staff.  You must have a responsible adult accompany you, remain during the surgery and drive you home.  Make sure you have directions to the surgical center/hospital to ensure you arrive on time. 2. If you are having surgery at [facility] or [facility] hospitals, you will need a copy of your medical history and physical form from your family physician within one month prior to the date of surgery. We will give you a form for your primary physician to complete.  3. We make every effort to accommodate the date you request for surgery.  However, there are times where surgery dates or times have to be moved.  We will contact you as soon as possible if a change in schedule is required.   4. No aspirin/ibuprofen for one week before surgery.  If you are on aspirin, any non-steroidal anti-inflammatory medications (Mobic, Aleve, Ibuprofen) should not be taken seven (7) days prior to your surgery.  You make take Tylenol for pain prior to surgery.  5. Medications - If you are taking daily heart and blood pressure medications, seizure, reflux, allergy, asthma, anxiety, pain or diabetes medications, make sure you notify the surgery center/hospital before the day of surgery so they can tell you which medications you should take or avoid the day of surgery. 6. No food or drink after midnight the night before surgery unless directed otherwise by surgical center/hospital staff. 7. No alcoholic beverages 24-hours prior to surgery.  No smoking 24-hours prior or 24-hours after  surgery. 8. Wear loose pants or shorts. They should be loose enough to fit over bandages, boots, and casts. 9. Don't wear slip-on shoes. Sneakers are preferred. 10. Bring your boot with you to the surgery center/hospital.  Also bring crutches or a walker if your physician has prescribed it for you.  If you do not have this equipment, it will be provided for you after surgery. 11. If you have not been contacted by the surgery center/hospital by the day before your surgery, call to confirm the date and time of your surgery. 12. Leave-time from work may vary depending on the type of surgery you have.  Appropriate arrangements should be made prior to surgery with your employer. 13. Prescriptions will be provided immediately following surgery by your doctor.  Fill these as soon as possible after surgery and take the medication as directed. Pain medications will not be refilled on weekends and must be approved by the doctor. 14. Remove nail polish on the operative foot and avoid getting pedicures prior to surgery. 15. Wash the night before surgery.  The night before surgery wash the foot and leg well with water and the antibacterial soap provided. Be sure to pay special attention to beneath the toenails and in between the toes.  Wash for at least three (3) minutes. Rinse thoroughly with water and dry well with a towel.  Perform this wash unless told not to do so by your physician.  Enclosed: 1 Ice pack (please put in freezer the night before surgery)   1 Hibiclens skin cleaner     Pre-op instructions  If you have any questions regarding the instructions, please do not hesitate to call our office.  [place]: [address], [place], [place] [address] -- [phone]  [place]: [address] [place], [place] [address] -- [phone]  [place]: [address]  , [place] [address] -- [phone]   Website: [email or web address] 

## 2018-12-12 NOTE — Progress Notes (Signed)
   HPI: 63 year old male presenting today for follow up evaluation of right 2nd toe pain. He reports continued pain. He also notes a popping sensation in the lateral right ankle that began 6 months ago. He states the pain began when he stepped on a power washer and rolled the ankle. He has been taking OTC pain medication for treatment. Being on the foot increases the ankle and toe pain. Patient is here for further evaluation and treatment.   Past Medical History:  Diagnosis Date  . Hypertension       Objective: Physical Exam General: The patient is alert and oriented x3 in no acute distress.  Dermatology: Skin is cool, dry and supple bilateral lower extremities. Negative for open lesions or macerations.  Vascular: Palpable pedal pulses bilaterally. No edema or erythema noted. Capillary refill within normal limits.  Neurological: Epicritic and protective threshold grossly intact bilaterally.   Musculoskeletal Exam: All pedal and ankle joints range of motion within normal limits bilateral. Muscle strength 5/5 in all groups bilateral. Hammertoe contracture deformity noted to the 2nd digit of the right foot. Pain with palpation noted to the 2nd MPJ of the right foot. Tarsal exostosis and an elongated 2nd metatarsal of the right foot noted.   Radiographic Exam: Hammertoe contracture deformity noted to the interphalangeal joints and MPJ of the respective hammertoe digits mentioned on clinical musculoskeletal exam. Tarsal exostosis of the right foot noted. Elongated 2nd metatarsal of the right foot noted.    Assessment: 1. 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 2nd MPJ capsulitis right foot 3. Tarsal exostosis right 4. Elongated 2nd metatarsal right    Plan of Care:  1. Patient evaluated. X-Rays reviewed.  2. Today we discussed the conservative versus surgical management of the presenting pathology. The patient opts for surgical management. All possible complications and details of  the procedure were explained. All patient questions were answered. No guarantees were expressed or implied. 3. Authorization for surgery was re-initiated today. Surgery will consist of DIPJ arthroplasty 2nd right; Weil osteotomy 2nd right; tarsal exostectomy right.  4. Return to clinic one week post op.     Edrick Kins, DPM [facility]  Dr. Edrick Kins, DPM    [address], [place] [address]                Office [phone]  Fax [phone]

## 2019-01-02 ENCOUNTER — Other Ambulatory Visit: Payer: Self-pay

## 2019-01-02 DIAGNOSIS — Z20822 Contact with and (suspected) exposure to covid-19: Secondary | ICD-10-CM

## 2019-01-04 LAB — NOVEL CORONAVIRUS, NAA: SARS-CoV-2, NAA: NOT DETECTED

## 2019-01-08 ENCOUNTER — Encounter: Payer: Medicare Other | Admitting: Podiatry

## 2019-01-15 ENCOUNTER — Encounter: Payer: Medicare Other | Admitting: Podiatry

## 2019-01-29 ENCOUNTER — Encounter: Payer: Medicare Other | Admitting: Podiatry

## 2019-01-30 ENCOUNTER — Encounter: Payer: Self-pay | Admitting: Podiatry

## 2019-01-30 ENCOUNTER — Other Ambulatory Visit: Payer: Self-pay | Admitting: Podiatry

## 2019-01-30 DIAGNOSIS — M25774 Osteophyte, right foot: Secondary | ICD-10-CM | POA: Diagnosis not present

## 2019-01-30 DIAGNOSIS — M21541 Acquired clubfoot, right foot: Secondary | ICD-10-CM | POA: Diagnosis not present

## 2019-01-30 DIAGNOSIS — M2041 Other hammer toe(s) (acquired), right foot: Secondary | ICD-10-CM | POA: Diagnosis not present

## 2019-01-30 MED ORDER — OXYCODONE-ACETAMINOPHEN 5-325 MG PO TABS: 1.0000 | ORAL_TABLET | Freq: Four times a day (QID) | ORAL | 0 refills | Status: AC | PRN

## 2019-01-30 NOTE — Progress Notes (Signed)
PRN postop 

## 2019-02-05 ENCOUNTER — Ambulatory Visit (INDEPENDENT_AMBULATORY_CARE_PROVIDER_SITE_OTHER): Payer: Self-pay | Admitting: Podiatry

## 2019-02-05 ENCOUNTER — Ambulatory Visit (INDEPENDENT_AMBULATORY_CARE_PROVIDER_SITE_OTHER): Payer: Medicare Other

## 2019-02-05 ENCOUNTER — Other Ambulatory Visit: Payer: Self-pay

## 2019-02-05 DIAGNOSIS — M2041 Other hammer toe(s) (acquired), right foot: Secondary | ICD-10-CM

## 2019-02-05 DIAGNOSIS — M7751 Other enthesopathy of right foot: Secondary | ICD-10-CM | POA: Diagnosis not present

## 2019-02-05 DIAGNOSIS — Z9889 Other specified postprocedural states: Secondary | ICD-10-CM

## 2019-02-05 MED ORDER — DOXYCYCLINE HYCLATE 100 MG PO TABS: 100.0000 mg | ORAL_TABLET | Freq: Two times a day (BID) | ORAL | 0 refills | Status: AC

## 2019-02-09 NOTE — Progress Notes (Signed)
   Subjective:  Patient presents today status post hammertoe repair right 2nd digit and exostectomy right. DOS: 01/30/2019. He states he is doing well. He reports some minor swelling and redness. He denies any pain or modifying factors. He states he has not had to take pain medication in the past few days. He has been using the CAM boot as directed. Patient is here for further evaluation and treatment.    Past Medical History:  Diagnosis Date  . Hypertension       Objective/Physical Exam Neurovascular status intact.  Skin incisions appear to be well coapted with sutures and staples intact. No sign of infectious process noted. No dehiscence. No active bleeding noted. Moderate edema noted to the surgical extremity.  Radiographic Exam:  Orthopedic hardware and osteotomies sites appear to be stable with routine healing.  Assessment: 1. s/p hammertoe repair right 2nd digit and exostectomy right. DOS: 01/30/2019   Plan of Care:  1. Patient was evaluated. X-rays reviewed 2. Dressing changed. Keep clean, dry and intact for one week.  3. Continue using CAM boot.  4. Prescription for Doxycycline 100 mg #14 provided to patient.  5. Return to clinic in one week. Post op shoe will be dispensed at next visit.   Sales for Doctor, hospital.    Edrick Kins, DPM [facility]  Dr. Edrick Kins, [address]                                        [place], [place] [address]                Office [phone]  Fax [phone]

## 2019-02-12 ENCOUNTER — Other Ambulatory Visit: Payer: Self-pay

## 2019-02-12 ENCOUNTER — Ambulatory Visit (INDEPENDENT_AMBULATORY_CARE_PROVIDER_SITE_OTHER): Payer: Medicare Other | Admitting: Podiatry

## 2019-02-12 DIAGNOSIS — Z9889 Other specified postprocedural states: Secondary | ICD-10-CM

## 2019-02-12 DIAGNOSIS — M7751 Other enthesopathy of right foot: Secondary | ICD-10-CM

## 2019-02-12 DIAGNOSIS — M2041 Other hammer toe(s) (acquired), right foot: Secondary | ICD-10-CM

## 2019-02-15 NOTE — Progress Notes (Signed)
   Subjective:  Patient presents today status post hammertoe repair right 2nd digit and exostectomy right. DOS: 01/30/2019. He states he is doing well. He reports some mild redness of the foot. He denies any pain or modifying factors. He has been using the CAM boot as directed. Patient is here for further evaluation and treatment.    Past Medical History:  Diagnosis Date  . Hypertension       Objective/Physical Exam Neurovascular status intact.  Skin incisions appear to be well coapted with sutures and staples intact. No sign of infectious process noted. No dehiscence. No active bleeding noted. Moderate edema noted to the surgical extremity.  Assessment: 1. s/p hammertoe repair right 2nd digit and exostectomy right. DOS: 01/30/2019   Plan of Care:  1. Patient was evaluated.  2. Staples removed.  3. Post op shoe dispensed. Discontinue using CAM boot.  4. Return to clinic in 2 weeks for percutaneous pin removal.   Sales for commercial roof repair.    Edrick Kins, DPM [facility]  Dr. Edrick Kins, [address]                                        [place], [place] [address]                Office [phone]  Fax [phone]

## 2019-02-26 ENCOUNTER — Other Ambulatory Visit: Payer: Self-pay

## 2019-02-26 ENCOUNTER — Ambulatory Visit (INDEPENDENT_AMBULATORY_CARE_PROVIDER_SITE_OTHER): Payer: Medicare Other | Admitting: Podiatry

## 2019-02-26 DIAGNOSIS — Z9889 Other specified postprocedural states: Secondary | ICD-10-CM

## 2019-02-26 DIAGNOSIS — M2041 Other hammer toe(s) (acquired), right foot: Secondary | ICD-10-CM

## 2019-02-26 DIAGNOSIS — M7751 Other enthesopathy of right foot: Secondary | ICD-10-CM

## 2019-03-01 NOTE — Progress Notes (Signed)
   Subjective:  Patient presents today status post hammertoe repair right 2nd digit and exostectomy right. DOS: 01/30/2019. He states he is doing well. He reports some intermittent sharp pain of the foot. He has been using the post op shoe as directed. He denies any aggravating factors. Patient is here for further evaluation and treatment.    Past Medical History:  Diagnosis Date  . Hypertension       Objective/Physical Exam Neurovascular status intact.  Skin incisions appear to be well coapted. No sign of infectious process noted. No dehiscence. No active bleeding noted. Moderate edema noted to the surgical extremity.  Assessment: 1. s/p hammertoe repair right 2nd digit and exostectomy right. DOS: 01/30/2019   Plan of Care:  1. Patient was evaluated.  2. Percutaneous pin removed.  3. Discontinue using post op shoe.  4. Return to clinic in 4 weeks for final post op visit.   Sales for Doctor, hospital.    Edrick Kins, DPM [facility]  Dr. Edrick Kins, [place]                                        [place], [place] [address]                Office [phone]  Fax [phone]

## 2019-03-24 ENCOUNTER — Other Ambulatory Visit: Payer: Self-pay

## 2019-03-24 ENCOUNTER — Ambulatory Visit (INDEPENDENT_AMBULATORY_CARE_PROVIDER_SITE_OTHER): Payer: Medicare Other | Admitting: Podiatry

## 2019-03-24 ENCOUNTER — Ambulatory Visit (INDEPENDENT_AMBULATORY_CARE_PROVIDER_SITE_OTHER): Payer: Medicare Other

## 2019-03-24 DIAGNOSIS — M2041 Other hammer toe(s) (acquired), right foot: Secondary | ICD-10-CM | POA: Diagnosis not present

## 2019-03-24 DIAGNOSIS — Z9889 Other specified postprocedural states: Secondary | ICD-10-CM

## 2019-03-24 DIAGNOSIS — S99911D Unspecified injury of right ankle, subsequent encounter: Secondary | ICD-10-CM | POA: Diagnosis not present

## 2019-03-30 NOTE — Progress Notes (Signed)
   Subjective:  Patient presents today status post hammertoe repair right 2nd digit and exostectomy right. DOS: 01/30/2019. He states he is doing well and denies any significant pain. He reports some associated numbness, swelling and stiffness of the foot. He has been wearing good shoe gear as directed. He denies any modifying factors at this time. Patient is here for further evaluation and treatment.    Past Medical History:  Diagnosis Date  . Hypertension       Objective/Physical Exam Neurovascular status intact.  Skin incisions appear to be well coapted. No sign of infectious process noted. No dehiscence. No active bleeding noted. Moderate edema noted to the surgical extremity.  Radiographic Exam:  Orthopedic hardware and osteotomies sites appear to be stable with routine healing.  Assessment: 1. s/p hammertoe repair right 2nd digit and exostectomy right. DOS: 01/30/2019   Plan of Care:  1. Patient was evaluated. X-Rays reviewed.  2. May resume full activity with no restrictions.  3. Recommended good shoe gear.  4. Return to clinic as needed.   Sales for Doctor, hospital.    Edrick Kins, DPM [facility]  Dr. Edrick Kins, [place]                                        [place], [place] [address]                Office [phone]  Fax [phone]
